# Patient Record
Sex: Male | Born: 1975 | Hispanic: Yes | Marital: Married | Smoking: Current some day smoker
Health system: Southern US, Community
[De-identification: ages and names within clinical notes are randomized; demographics above are authoritative.]

## PROBLEM LIST (undated history)

## (undated) DIAGNOSIS — R7309 Other abnormal glucose: Secondary | ICD-10-CM

## (undated) DIAGNOSIS — G5601 Carpal tunnel syndrome, right upper limb: Secondary | ICD-10-CM

## (undated) DIAGNOSIS — L817 Pigmented purpuric dermatosis: Secondary | ICD-10-CM

## (undated) DIAGNOSIS — R040 Epistaxis: Secondary | ICD-10-CM

## (undated) DIAGNOSIS — Z9889 Other specified postprocedural states: Secondary | ICD-10-CM

## (undated) DIAGNOSIS — E8881 Metabolic syndrome: Secondary | ICD-10-CM

## (undated) DIAGNOSIS — R112 Nausea with vomiting, unspecified: Secondary | ICD-10-CM

## (undated) DIAGNOSIS — K760 Fatty (change of) liver, not elsewhere classified: Secondary | ICD-10-CM

## (undated) DIAGNOSIS — R413 Other amnesia: Secondary | ICD-10-CM

## (undated) DIAGNOSIS — R0683 Snoring: Secondary | ICD-10-CM

## (undated) DIAGNOSIS — G5620 Lesion of ulnar nerve, unspecified upper limb: Secondary | ICD-10-CM

## (undated) DIAGNOSIS — E7800 Pure hypercholesterolemia, unspecified: Secondary | ICD-10-CM

## (undated) DIAGNOSIS — R1032 Left lower quadrant pain: Secondary | ICD-10-CM

## (undated) DIAGNOSIS — G5621 Lesion of ulnar nerve, right upper limb: Secondary | ICD-10-CM

## (undated) DIAGNOSIS — Z8781 Personal history of (healed) traumatic fracture: Secondary | ICD-10-CM

## (undated) DIAGNOSIS — R202 Paresthesia of skin: Principal | ICD-10-CM

## (undated) DIAGNOSIS — I10 Essential (primary) hypertension: Secondary | ICD-10-CM

## (undated) HISTORY — DX: Paresthesia of skin: R20.2

## (undated) HISTORY — DX: Lesion of ulnar nerve, unspecified upper limb: G56.20

## (undated) HISTORY — DX: Metabolic syndrome: E88.81

## (undated) HISTORY — DX: Essential (primary) hypertension: I10

## (undated) HISTORY — DX: Lesion of ulnar nerve, right upper limb: G56.21

## (undated) HISTORY — DX: Pigmented purpuric dermatosis: L81.7

## (undated) HISTORY — DX: Snoring: R06.83

## (undated) HISTORY — DX: Epistaxis: R04.0

## (undated) HISTORY — DX: Fatty (change of) liver, not elsewhere classified: K76.0

## (undated) HISTORY — DX: Left lower quadrant pain: R10.32

## (undated) HISTORY — PX: LACERATION REPAIR: SHX5168

## (undated) HISTORY — DX: Other abnormal glucose: R73.09

---
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
## 1998-11-13 ENCOUNTER — Emergency Department (HOSPITAL_COMMUNITY): Admission: EM | Admit: 1998-11-13 | Discharge: 1998-11-13 | Payer: Self-pay | Admitting: Emergency Medicine

## 1998-12-26 ENCOUNTER — Encounter: Payer: Self-pay | Admitting: Emergency Medicine

## 1998-12-26 ENCOUNTER — Emergency Department (HOSPITAL_COMMUNITY): Admission: EM | Admit: 1998-12-26 | Discharge: 1998-12-26 | Payer: Self-pay | Admitting: Emergency Medicine

## 1999-01-07 ENCOUNTER — Emergency Department (HOSPITAL_COMMUNITY): Admission: EM | Admit: 1999-01-07 | Discharge: 1999-01-07 | Payer: Self-pay

## 1999-01-10 ENCOUNTER — Emergency Department (HOSPITAL_COMMUNITY): Admission: EM | Admit: 1999-01-10 | Discharge: 1999-01-10 | Payer: Self-pay | Admitting: Emergency Medicine

## 1999-01-10 ENCOUNTER — Encounter: Payer: Self-pay | Admitting: Emergency Medicine

## 2001-10-21 ENCOUNTER — Encounter: Payer: Self-pay | Admitting: *Deleted

## 2001-10-21 ENCOUNTER — Emergency Department (HOSPITAL_COMMUNITY): Admission: EM | Admit: 2001-10-21 | Discharge: 2001-10-22 | Payer: Self-pay | Admitting: Emergency Medicine

## 2001-12-15 ENCOUNTER — Emergency Department (HOSPITAL_COMMUNITY): Admission: EM | Admit: 2001-12-15 | Discharge: 2001-12-15 | Payer: Self-pay | Admitting: Emergency Medicine

## 2003-03-31 ENCOUNTER — Emergency Department (HOSPITAL_COMMUNITY): Admission: EM | Admit: 2003-03-31 | Discharge: 2003-03-31 | Payer: Self-pay | Admitting: Emergency Medicine

## 2004-04-28 ENCOUNTER — Ambulatory Visit: Payer: Self-pay | Admitting: Sports Medicine

## 2005-04-05 ENCOUNTER — Ambulatory Visit: Payer: Self-pay | Admitting: Family Medicine

## 2005-05-21 HISTORY — PX: KNEE ARTHROSCOPY: SHX127

## 2005-06-07 ENCOUNTER — Emergency Department (HOSPITAL_COMMUNITY): Admission: EM | Admit: 2005-06-07 | Discharge: 2005-06-07 | Payer: Self-pay | Admitting: Emergency Medicine

## 2005-06-07 DIAGNOSIS — M2390 Unspecified internal derangement of unspecified knee: Secondary | ICD-10-CM | POA: Insufficient documentation

## 2005-06-13 ENCOUNTER — Emergency Department: Payer: Self-pay | Admitting: Emergency Medicine

## 2005-06-18 ENCOUNTER — Ambulatory Visit: Payer: Self-pay | Admitting: Family Medicine

## 2005-06-19 ENCOUNTER — Encounter: Admission: RE | Admit: 2005-06-19 | Discharge: 2005-06-19 | Payer: Self-pay | Admitting: Family Medicine

## 2005-06-22 ENCOUNTER — Encounter: Admission: RE | Admit: 2005-06-22 | Discharge: 2005-06-22 | Payer: Self-pay | Admitting: Family Medicine

## 2005-06-25 ENCOUNTER — Ambulatory Visit: Payer: Self-pay | Admitting: Family Medicine

## 2005-07-09 ENCOUNTER — Ambulatory Visit: Payer: Self-pay | Admitting: Family Medicine

## 2005-07-16 ENCOUNTER — Ambulatory Visit: Payer: Self-pay | Admitting: Unknown Physician Specialty

## 2005-08-27 ENCOUNTER — Ambulatory Visit: Payer: Self-pay | Admitting: Family Medicine

## 2005-09-19 ENCOUNTER — Ambulatory Visit: Payer: Self-pay | Admitting: Family Medicine

## 2006-05-21 HISTORY — PX: ELBOW SURGERY: SHX618

## 2006-07-18 DIAGNOSIS — IMO0002 Reserved for concepts with insufficient information to code with codable children: Secondary | ICD-10-CM | POA: Insufficient documentation

## 2006-07-18 DIAGNOSIS — H539 Unspecified visual disturbance: Secondary | ICD-10-CM | POA: Insufficient documentation

## 2006-07-20 DIAGNOSIS — Z8781 Personal history of (healed) traumatic fracture: Secondary | ICD-10-CM

## 2006-07-20 HISTORY — DX: Personal history of (healed) traumatic fracture: Z87.81

## 2006-07-25 DIAGNOSIS — S12100A Unspecified displaced fracture of second cervical vertebra, initial encounter for closed fracture: Secondary | ICD-10-CM | POA: Insufficient documentation

## 2006-08-07 ENCOUNTER — Telehealth: Payer: Self-pay | Admitting: *Deleted

## 2006-10-28 ENCOUNTER — Ambulatory Visit: Payer: Self-pay | Admitting: Family Medicine

## 2006-10-28 DIAGNOSIS — B353 Tinea pedis: Secondary | ICD-10-CM | POA: Insufficient documentation

## 2006-10-28 DIAGNOSIS — M7700 Medial epicondylitis, unspecified elbow: Secondary | ICD-10-CM | POA: Insufficient documentation

## 2006-10-28 DIAGNOSIS — I10 Essential (primary) hypertension: Secondary | ICD-10-CM | POA: Insufficient documentation

## 2006-10-28 DIAGNOSIS — H53009 Unspecified amblyopia, unspecified eye: Secondary | ICD-10-CM | POA: Insufficient documentation

## 2006-10-28 LAB — CONVERTED CEMR LAB
BUN: 24 mg/dL — ABNORMAL HIGH (ref 6–23)
Calcium: 9.1 mg/dL (ref 8.4–10.5)
Creatinine, Ser: 0.96 mg/dL (ref 0.40–1.50)
Glucose, Bld: 87 mg/dL (ref 70–99)
HDL: 39 mg/dL — ABNORMAL LOW (ref >39)
LDL Cholesterol: 144 mg/dL — ABNORMAL HIGH (ref 0–99)
Sodium: 139 meq/L (ref 135–145)
Triglycerides: 346 mg/dL — ABNORMAL HIGH (ref <150)

## 2006-10-29 ENCOUNTER — Encounter: Payer: Self-pay | Admitting: Family Medicine

## 2006-10-31 ENCOUNTER — Encounter: Payer: Self-pay | Admitting: Family Medicine

## 2006-11-01 ENCOUNTER — Encounter: Payer: Self-pay | Admitting: Family Medicine

## 2006-11-04 ENCOUNTER — Encounter: Payer: Self-pay | Admitting: Family Medicine

## 2006-11-06 ENCOUNTER — Encounter: Payer: Self-pay | Admitting: Family Medicine

## 2006-11-11 ENCOUNTER — Encounter: Payer: Self-pay | Admitting: Family Medicine

## 2006-11-26 ENCOUNTER — Encounter: Payer: Self-pay | Admitting: Family Medicine

## 2006-12-11 ENCOUNTER — Encounter: Payer: Self-pay | Admitting: Family Medicine

## 2007-03-19 ENCOUNTER — Encounter: Payer: Self-pay | Admitting: *Deleted

## 2007-03-19 ENCOUNTER — Ambulatory Visit: Payer: Self-pay | Admitting: Family Medicine

## 2007-06-02 DIAGNOSIS — H8110 Benign paroxysmal vertigo, unspecified ear: Secondary | ICD-10-CM | POA: Insufficient documentation

## 2007-08-18 ENCOUNTER — Ambulatory Visit (HOSPITAL_COMMUNITY): Admission: RE | Admit: 2007-08-18 | Discharge: 2007-08-18 | Payer: Self-pay | Admitting: Family Medicine

## 2007-08-18 ENCOUNTER — Ambulatory Visit: Payer: Self-pay | Admitting: Family Medicine

## 2007-08-18 ENCOUNTER — Telehealth: Payer: Self-pay | Admitting: *Deleted

## 2007-08-19 ENCOUNTER — Encounter: Payer: Self-pay | Admitting: Family Medicine

## 2007-08-20 ENCOUNTER — Encounter: Payer: Self-pay | Admitting: Family Medicine

## 2007-08-25 ENCOUNTER — Encounter: Payer: Self-pay | Admitting: Family Medicine

## 2007-08-26 ENCOUNTER — Encounter: Payer: Self-pay | Admitting: Family Medicine

## 2007-08-27 ENCOUNTER — Encounter: Payer: Self-pay | Admitting: Family Medicine

## 2007-09-08 ENCOUNTER — Encounter: Payer: Self-pay | Admitting: Family Medicine

## 2007-09-09 ENCOUNTER — Encounter: Payer: Self-pay | Admitting: Family Medicine

## 2007-09-10 ENCOUNTER — Encounter: Payer: Self-pay | Admitting: Family Medicine

## 2007-09-10 DIAGNOSIS — G5620 Lesion of ulnar nerve, unspecified upper limb: Secondary | ICD-10-CM

## 2007-09-10 HISTORY — DX: Lesion of ulnar nerve, unspecified upper limb: G56.20

## 2007-09-24 ENCOUNTER — Encounter: Payer: Self-pay | Admitting: Family Medicine

## 2007-09-25 ENCOUNTER — Ambulatory Visit: Payer: Self-pay | Admitting: Family Medicine

## 2007-09-25 DIAGNOSIS — G44319 Acute post-traumatic headache, not intractable: Secondary | ICD-10-CM | POA: Insufficient documentation

## 2007-09-26 ENCOUNTER — Encounter: Payer: Self-pay | Admitting: Family Medicine

## 2007-10-08 ENCOUNTER — Encounter: Payer: Self-pay | Admitting: Family Medicine

## 2007-10-16 ENCOUNTER — Encounter: Payer: Self-pay | Admitting: Family Medicine

## 2007-10-27 ENCOUNTER — Telehealth: Payer: Self-pay | Admitting: Family Medicine

## 2007-10-29 DIAGNOSIS — M542 Cervicalgia: Secondary | ICD-10-CM | POA: Insufficient documentation

## 2007-10-30 ENCOUNTER — Encounter: Payer: Self-pay | Admitting: Family Medicine

## 2007-11-19 ENCOUNTER — Telehealth: Payer: Self-pay | Admitting: Family Medicine

## 2008-02-13 ENCOUNTER — Ambulatory Visit: Payer: Self-pay | Admitting: Family Medicine

## 2008-04-29 ENCOUNTER — Encounter: Payer: Self-pay | Admitting: Family Medicine

## 2008-05-28 ENCOUNTER — Telehealth: Payer: Self-pay | Admitting: *Deleted

## 2009-03-14 ENCOUNTER — Ambulatory Visit: Payer: Self-pay | Admitting: Family Medicine

## 2009-06-22 ENCOUNTER — Telehealth: Payer: Self-pay | Admitting: Family Medicine

## 2010-04-06 ENCOUNTER — Telehealth (INDEPENDENT_AMBULATORY_CARE_PROVIDER_SITE_OTHER): Payer: Self-pay | Admitting: *Deleted

## 2010-04-16 ENCOUNTER — Emergency Department (HOSPITAL_COMMUNITY): Admission: EM | Admit: 2010-04-16 | Discharge: 2010-04-16 | Payer: Self-pay | Admitting: Family Medicine

## 2010-06-18 LAB — CONVERTED CEMR LAB
BUN: 17 mg/dL (ref 6–23)
CO2: 25 meq/L (ref 19–32)
Calcium: 9.4 mg/dL (ref 8.4–10.5)
Creatinine, Ser: 0.92 mg/dL (ref 0.40–1.50)
Glucose, Bld: 93 mg/dL (ref 70–99)
Glucose, Urine, Semiquant: NEGATIVE
Protein, U semiquant: NEGATIVE

## 2010-06-20 NOTE — Progress Notes (Signed)
Summary: phn msg  Phone Note Call from Patient Call back at [phone]   Caller: Spouse-Calvin Johnston Summary of Call: daughter Calvin Johnston) was tested positive for strep a couple weeks ago and now he has fever/sore throat - was told that if any family member has symptoms that they would call something in for him. [facility] Initial call taken by: De Nurse,  April 06, 2010 9:13 AM  Follow-up for Phone Call        spoke with patient and he states Calvin Johnston was treated  at Urgent Care  two weeks ago for strep. advised that we will need to schedule him an appointment here to be seen  in order to treat him. offerred appointment this afternoon. states he has had feve r for 3 days.  he will need to call back about coming in he states .  suggested he can try calling urgent care to ask if they will treat him since this is what they told him. he will call back if appointment is needed. Follow-up by: Theresia Lo RN,  April 06, 2010 11:31 AM

## 2010-06-20 NOTE — Progress Notes (Signed)
Summary: Tooth Pain   Phone Note Call from Patient   Summary of Call: Tooth pain since yesterday. Took tylenol - did not help.   Took antibiotic from [place] - "ampicillina" Denies fever, difficulty w/ breathing or swallowing, trauma to tooth.  Advised to take ibuprofen for pain, call for appointment to be seen in morning. Reviewed red flags to come in to ER.  Initial call taken by: Bobby Rumpf  MD,  June 22, 2009 10:44 PM     Appended Document: Tooth Pain  left message

## 2010-10-23 ENCOUNTER — Ambulatory Visit (INDEPENDENT_AMBULATORY_CARE_PROVIDER_SITE_OTHER): Payer: Self-pay | Admitting: Family Medicine

## 2010-10-23 ENCOUNTER — Encounter: Payer: Self-pay | Admitting: Family Medicine

## 2010-10-23 VITALS — BP 150/90 | HR 100 | Wt 195.0 lb

## 2010-10-23 DIAGNOSIS — I10 Essential (primary) hypertension: Secondary | ICD-10-CM

## 2010-10-23 LAB — CBC
MCHC: 33.9 g/dL (ref 30.0–36.0)
Platelets: 250 10*3/uL (ref 150–400)
RDW: 13.4 % (ref 11.5–15.5)
WBC: 7.1 10*3/uL (ref 4.0–10.5)

## 2010-10-23 LAB — BASIC METABOLIC PANEL
Chloride: 105 mEq/L (ref 96–112)
Potassium: 4.2 mEq/L (ref 3.5–5.3)

## 2010-10-23 MED ORDER — AMLODIPINE BESYLATE 5 MG PO TABS: 5.0000 mg | ORAL_TABLET | Freq: Every day | ORAL | Status: DC

## 2010-10-23 NOTE — Patient Instructions (Addendum)
Start Amlodipine 5 mg tablet by mouth once a day to treat your high Blood Pressure. Check your Blood Pressure three times a week. If you are averaging over 140/90 we may need to increase your Amlodipine to 10 mg daily.   Dr Forest Pruden will let you know the results of your blood tests.

## 2010-10-24 ENCOUNTER — Encounter: Payer: Self-pay | Admitting: Family Medicine

## 2010-10-24 NOTE — Progress Notes (Signed)
  Subjective:    Patient ID: Calvin Johnston, male    DOB: 12/03/1975, 35 y.o.   MRN: [id number]  HPI Patient is accompanied by his wife.   HYPERTENSION  Disease Monitoring  Blood pressure range: At home BP by wife is 140 to 170 SBP  Chest pain: no   Dyspnea: no   Claudication: no   Medication compliance: On no medications.  Was on HCTZ in past.   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 No seizures.  No syncope. No chest pain.  No shortness of breath.  Improves when he cools down.  Makes himself drink plenty of fluid at work. 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  No one is hiring outside his skilled trade.   Review of Systems      Objective:   Physical Exam  Constitutional: He appears well-nourished. No distress.       Obese  HENT:  Right Ear: External ear normal.  Left Ear: External ear normal.  Eyes: Conjunctivae and EOM are normal. Pupils are equal, round, and reactive to light.  Fundoscopic exam:      The right eye shows arteriolar narrowing. The right eye shows no papilledema.       The left eye shows arteriolar narrowing. The left eye shows no papilledema.  Neck: Trachea normal and normal range of motion. Neck supple. Normal carotid pulses, no hepatojugular reflux and no JVD present. Carotid bruit is not present. No mass present.  Cardiovascular: Normal rate, regular rhythm, S1 normal, S2 normal, normal heart sounds and intact distal pulses.  PMI is not displaced.  Exam reveals no gallop.   No murmur heard. Pulmonary/Chest: Effort normal and breath sounds normal.  Abdominal: Soft. Normal appearance, normal aorta and bowel sounds are normal. He exhibits no distension and no abdominal bruit. There is no hepatosplenomegaly. There is no  tenderness.  Neurological: He is alert.  Skin: He is not diaphoretic.  Psychiatric: He has a normal mood and affect. His speech is normal and behavior is normal. Thought content normal. Cognition and memory are normal.          Assessment & Plan:

## 2010-10-25 ENCOUNTER — Encounter: Payer: Self-pay | Admitting: Family Medicine

## 2010-10-25 NOTE — Assessment & Plan Note (Addendum)
Patient with persistent elevated BP with a family history of Hypertension. No evidence of end-organ damage.   Plan to start Amlodipine 5mg  po daily. His wife will check BP three times a week and bring in record on her visit with her dgt next month. Will titrate based on patients BP home diary readings.  Need to check fasting Lipids next visit. 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  BP Readings from Last 3 Encounters:  10/23/10 150/90  09/25/07 143/94  08/18/07 151/95   Lab Results  Component Value Date   WBC 7.1 10/23/2010   HGB 15.4 10/23/2010   HCT 45.4 10/23/2010   MCV 88.0 10/23/2010   PLT 250 10/23/2010   Lab Results  Component Value Date   CREATININE 1.00 10/23/2010   BUN 21 10/23/2010   NA 140 10/23/2010   K 4.2 10/23/2010   CL 105 10/23/2010   CO2 26 10/23/2010

## 2010-11-17 ENCOUNTER — Telehealth: Payer: Self-pay | Admitting: Family Medicine

## 2010-11-17 DIAGNOSIS — I10 Essential (primary) hypertension: Secondary | ICD-10-CM

## 2010-11-17 NOTE — Telephone Encounter (Signed)
His BP has been running between 140/72 - 145/92 and is not sure if his meds needs to be adjusted.

## 2010-11-17 NOTE — Telephone Encounter (Signed)
Will forward to MD, Patient just seen earlier this month, does he need to come back in?

## 2010-11-20 MED ORDER — AMLODIPINE BESYLATE 5 MG PO TABS: 10.0000 mg | ORAL_TABLET | Freq: Every day | ORAL | Status: DC

## 2010-11-20 MED ORDER — AMLODIPINE BESYLATE 10 MG PO TABS: 10.0000 mg | ORAL_TABLET | Freq: Every day | ORAL | Status: DC

## 2010-11-20 NOTE — Telephone Encounter (Signed)
Ms Calvin Johnston informed of message from MD, she will let [name] know and contact us if any other concerns.

## 2010-11-20 NOTE — Telephone Encounter (Signed)
Please ask Mr Calvin Johnston to increase his Amlodipine 5 mg tablet to two tablets a day from one a day to improve his blood pressure control even more.  Let him know that when he refills his blood pressure medication, that I will refill his Amlodipine with 10 mg tablets instead of 5 mg tablets.

## 2011-01-08 ENCOUNTER — Ambulatory Visit: Payer: Self-pay | Admitting: Family Medicine

## 2011-01-09 ENCOUNTER — Ambulatory Visit (INDEPENDENT_AMBULATORY_CARE_PROVIDER_SITE_OTHER): Payer: Self-pay | Admitting: Family Medicine

## 2011-01-09 ENCOUNTER — Ambulatory Visit (HOSPITAL_COMMUNITY)
Admission: RE | Admit: 2011-01-09 | Discharge: 2011-01-09 | Disposition: A | Discharge status: Home / Self Care | Payer: Self-pay | Source: Ambulatory Visit | Attending: Family Medicine | Admitting: Family Medicine

## 2011-01-09 ENCOUNTER — Encounter: Payer: Self-pay | Admitting: Family Medicine

## 2011-01-09 VITALS — BP 155/91 | HR 90 | Temp 98.2°F | Ht 65.75 in | Wt 196.0 lb

## 2011-01-09 DIAGNOSIS — R05 Cough: Secondary | ICD-10-CM | POA: Insufficient documentation

## 2011-01-09 DIAGNOSIS — R059 Cough, unspecified: Secondary | ICD-10-CM

## 2011-01-09 DIAGNOSIS — J40 Bronchitis, not specified as acute or chronic: Secondary | ICD-10-CM

## 2011-01-09 MED ORDER — AZITHROMYCIN 250 MG PO TABS: ORAL_TABLET | ORAL | Status: AC

## 2011-01-09 NOTE — Patient Instructions (Signed)
Bronchitis Bronchitis is the body's way of reacting to injury and/or infection (inflammation) of the bronchi. Bronchi are the air tubes that extend from the windpipe into the lungs. If the inflammation becomes severe, it may cause shortness of breath.  CAUSES Inflammation may be caused by:  A virus.   Germs (bacteria).   Dust.   Allergens.   Pollutants and many other irritants.  The cells lining the bronchial tree are covered with tiny hairs (cilia). These constantly beat upward, away from the lungs, toward the mouth. This keeps the lungs free of pollutants. When these cells become too irritated and are unable to do their job, mucus begins to develop. This causes the characteristic cough of bronchitis. The cough clears the lungs when the cilia are unable to do their job. Without either of these protective mechanisms, the mucus would settle in the lungs. Then you would develop pneumonia. Smoking is a common cause of bronchitis and can contribute to pneumonia. Stopping this habit is the single most important thing you can do to help yourself. TREATMENT  Your caregiver may prescribe an antibiotic if the cough is caused by bacteria. Also, medicines that open up your airways make it easier to breathe. Your caregiver may also recommend or prescribe an expectorant. It will loosen the mucus to be coughed up. Only take over-the-counter or prescription medicines for pain, discomfort, or fever as directed by your caregiver.   Removing whatever causes the problem (smoking, for example) is critical to preventing the problem from getting worse.   Cough suppressants may be prescribed for relief of cough symptoms.   Inhaled medicines may be prescribed to help with symptoms now and to help prevent problems from returning.   For those with recurrent (chronic) bronchitis, there may be a need for steroid medicines.  SEEK IMMEDIATE MEDICAL CARE IF:  During treatment, you develop more pus-like mucus  (purulent sputum).   You or your child has an oral temperature above 100.2, not controlled by medicine.   Your baby is older than 3 months with a rectal temperature of 102 F (38.9 C) or higher.   Your baby is 59 months old or younger with a rectal temperature of 100.4 F (38 C) or higher.   You become progressively more ill.   You have increased difficulty breathing, wheezing, or shortness of breath.  It is necessary to seek immediate medical care if you are elderly or sick from any other disease. MAKE SURE YOU:  Understand these instructions.   Will watch your condition.   Will get help right away if you are not doing well or get worse.  Document Released: 05/07/2005 Document Re-Released: 08/01/2009 [facility] Patient Information 2011 [place], [place].

## 2011-01-09 NOTE — Progress Notes (Signed)
  Subjective:    Patient ID: Calvin Johnston, male    DOB: 1975-08-01, 35 y.o.   MRN: [id number]  HPI Pt here for acute visit with cough x 1 month.  Pt reports cough x 1 month. Cough is non productive.  Pt also with rhinorrhea and nasal congestion initially, nasal congestion has persisted. No fevers, increased WOB. Pt is a nonsmoker, does work as a Presenter, broadcasting.  Pt has used OTC cough and cold medications with minimal improvement in sxs. + sick contacts in wife and daughter. Both have had cough >4 weeks. Both wife and daughter required antibiotics and CXRs.   Review of Systems See HPI     Objective:   Physical Exam Gen: up in chair, NAD HEENT: NCAT, EOMI, TMs clear bilaterally, + nasal erythema bilaterally  CV: RRR, no murmurs auscultated PULM: CTAB, no wheezes, rales ,+ cough with deep inspiration. ABD: S/NT/+ bowel sounds  EXT: 2+ peripheral pulses   Assessment & Plan:

## 2011-01-09 NOTE — Assessment & Plan Note (Signed)
Likely viral process with concern for atypical process given sick contacts and work exposure (inhaled particulates). Will obtain CXR. Will cover with azithromycin. Discussed red flags for return.

## 2011-01-10 ENCOUNTER — Encounter: Payer: Self-pay | Admitting: Family Medicine

## 2011-04-22 ENCOUNTER — Telehealth: Payer: Self-pay | Admitting: Family Medicine

## 2011-04-22 NOTE — Telephone Encounter (Signed)
Patient with hemorrhoids bought preparation H OTC. ? If safe with Norvasc. Looked it up, no interactions. Should be safe. Advised use, but stop of pt develops HA, CP, blurred vision. Wife expressed understanding and agreed with plan.

## 2011-05-16 ENCOUNTER — Telehealth: Payer: Self-pay | Admitting: Family Medicine

## 2011-05-16 NOTE — Telephone Encounter (Signed)
Pt's wife called, he is out of town for work right now, but is having a bad headache.  His blood pressure has been normal. No vision changes, nausea or vomiting, no fevers.  Patient's wife says he described a tingling sensation too.  He used to have headaches years ago when he had a head injury, but has not recently.    Advised patient should likely be evaluated in the next day or so if headache persists.  Concern for migraines.  Advised to go to ER for vision changes, dizziness, "black out" spells, or elevated blood pressure.

## 2011-05-19 ENCOUNTER — Emergency Department (HOSPITAL_COMMUNITY)
Admission: EM | Admit: 2011-05-19 | Discharge: 2011-05-20 | Disposition: A | Discharge status: Home / Self Care | Payer: Self-pay | Attending: Emergency Medicine | Admitting: Emergency Medicine

## 2011-05-19 ENCOUNTER — Encounter (HOSPITAL_COMMUNITY): Payer: Self-pay | Admitting: *Deleted

## 2011-05-19 DIAGNOSIS — E7800 Pure hypercholesterolemia, unspecified: Secondary | ICD-10-CM | POA: Insufficient documentation

## 2011-05-19 DIAGNOSIS — R209 Unspecified disturbances of skin sensation: Secondary | ICD-10-CM | POA: Insufficient documentation

## 2011-05-19 DIAGNOSIS — I10 Essential (primary) hypertension: Secondary | ICD-10-CM | POA: Insufficient documentation

## 2011-05-19 DIAGNOSIS — R51 Headache: Secondary | ICD-10-CM | POA: Insufficient documentation

## 2011-05-19 MED ORDER — SODIUM CHLORIDE 0.9 % IV BOLUS (SEPSIS)
1000.0000 mL | Freq: Once | INTRAVENOUS | Status: AC
  Administered 2011-05-19: 1000 mL via INTRAVENOUS

## 2011-05-19 MED ORDER — METOCLOPRAMIDE HCL 5 MG/ML IJ SOLN
10.0000 mg | Freq: Once | INTRAMUSCULAR | Status: AC
  Administered 2011-05-19: 10 mg via INTRAVENOUS
  Filled 2011-05-19: qty 2

## 2011-05-19 MED ORDER — DIPHENHYDRAMINE HCL 50 MG/ML IJ SOLN
25.0000 mg | Freq: Once | INTRAMUSCULAR | Status: AC
  Administered 2011-05-19: via INTRAVENOUS
  Filled 2011-05-19: qty 1

## 2011-05-19 MED ORDER — DEXAMETHASONE SODIUM PHOSPHATE 10 MG/ML IJ SOLN
10.0000 mg | Freq: Once | INTRAMUSCULAR | Status: AC
  Administered 2011-05-19: 10 mg via INTRAVENOUS
  Filled 2011-05-19: qty 1
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
## 2011-05-19 NOTE — ED Notes (Signed)
Complaining of headache for 3 weeks. States he woke up with headache one morning. Has taken OTC with no relief. Bending over, picking up heavy objects makes worse. Also states he has numbness and tingling all over head. No sensitivity to light or sound.

## 2011-05-19 NOTE — ED Notes (Signed)
Headache for the past 3 weeks  No nv or diarrhea.  He has taken otc meds that have not helped

## 2011-05-19 NOTE — ED Notes (Signed)
Midlevel at bedside evaluating patient

## 2011-05-20 ENCOUNTER — Emergency Department (HOSPITAL_COMMUNITY): Payer: Self-pay

## 2011-05-20 MED ORDER — KETOROLAC TROMETHAMINE 30 MG/ML IJ SOLN
30.0000 mg | Freq: Once | INTRAMUSCULAR | Status: AC
  Administered 2011-05-20: 30 mg via INTRAVENOUS
  Filled 2011-05-20: qty 1

## 2011-05-20 MED ORDER — OXYCODONE-ACETAMINOPHEN 5-325 MG PO TABS: 2.0000 | ORAL_TABLET | ORAL | Status: AC | PRN

## 2011-05-20 NOTE — ED Provider Notes (Signed)
Medical screening examination/treatment/procedure(s) were performed by non-physician practitioner and as supervising physician I was immediately available for consultation/collaboration.   Joya Gaskins, MD 05/20/11 [phone]

## 2011-05-21 NOTE — ED Notes (Signed)
Pt's wife called in to say that pt's Percocet was causing him continuous hiccups and wanted to know if we could call something else in for Headaches.  Educational psychologist, PA treated the pt yesterday and was here today and gave me a verbal order for Ultram 50mg , 1 tab q 4-6 hrs PRN pain, quan 20. It was called into the [facility] at [phone]

## 2012-01-05 ENCOUNTER — Telehealth: Payer: Self-pay | Admitting: Family Medicine
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
## 2012-01-07 ENCOUNTER — Other Ambulatory Visit: Payer: Self-pay | Admitting: Family Medicine

## 2012-01-07 NOTE — Telephone Encounter (Signed)
Appt with PCP required for additional refills

## 2012-02-25 ENCOUNTER — Other Ambulatory Visit: Payer: Self-pay | Admitting: Family Medicine

## 2012-05-22 ENCOUNTER — Telehealth: Payer: Self-pay | Admitting: Family Medicine

## 2012-05-22 DIAGNOSIS — J111 Influenza due to unidentified influenza virus with other respiratory manifestations: Secondary | ICD-10-CM

## 2012-05-22 MED ORDER — OSELTAMIVIR PHOSPHATE 75 MG PO CAPS: 75.0000 mg | ORAL_CAPSULE | Freq: Two times a day (BID) | ORAL | Status: AC

## 2012-05-22 NOTE — Telephone Encounter (Signed)
Wife is calling to let us know that he has a fever, chills, and cough. She is concerned that he may have the flu. This started this morning. She has tried to Dayquil, and his fever is 101.2. Daughter recently had the flu on 05/14/12. She is calling to see if anything can be done. Give symptoms < 24 hours and known flu exposure, I will send in prescription for tamiflu. She will also give him ibuprofen 400 mg q 8 hours.   Si Raider Clinton Sawyer, MD, MBA 05/22/2012, 7:17 PM Family Medicine Resident, PGY-2 [phone] pager

## 2012-05-22 NOTE — Telephone Encounter (Signed)
Wife reports since inhaling dust patient has been trying to clear throat.  All night had this sensation of having to clear throat. He has calld her and ask her to call this office. Consulted with Dr.  Mauricio Po and [place] and they advised that nothing OTC should be seen .   Can schedule tomorrow AM , go to UC or ED if worsens.   Also he states sometimes he has some chest pain. Advised if having chest pain needs to be seen today . Wife will call him back now and discuss and give message from MD.
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
## 2013-03-13 ENCOUNTER — Other Ambulatory Visit: Payer: Self-pay | Admitting: Family Medicine

## 2013-03-13 NOTE — Telephone Encounter (Signed)
Blue team-Patient has not been seen in > 2 years. Needs office visit. Gave #90 of amlodipine.

## 2013-03-16 NOTE — Telephone Encounter (Signed)
Pt informed that he will need appt.  Rest of family sees McDiarmid so he will be changed to him as well. [place], Maryjo [place]

## 2013-07-20 ENCOUNTER — Other Ambulatory Visit: Payer: Self-pay | Admitting: Family Medicine

## 2013-09-23 ENCOUNTER — Ambulatory Visit (INDEPENDENT_AMBULATORY_CARE_PROVIDER_SITE_OTHER): Payer: BC Managed Care – PPO | Admitting: Family Medicine

## 2013-09-23 ENCOUNTER — Encounter: Payer: Self-pay | Admitting: Family Medicine

## 2013-09-23 VITALS — BP 136/78 | HR 100 | Temp 98.9°F | Resp 18 | Wt 206.0 lb

## 2013-09-23 DIAGNOSIS — S99929A Unspecified injury of unspecified foot, initial encounter: Secondary | ICD-10-CM | POA: Insufficient documentation

## 2013-09-23 DIAGNOSIS — G5620 Lesion of ulnar nerve, unspecified upper limb: Secondary | ICD-10-CM

## 2013-09-23 DIAGNOSIS — I10 Essential (primary) hypertension: Secondary | ICD-10-CM

## 2013-09-23 DIAGNOSIS — S99919A Unspecified injury of unspecified ankle, initial encounter: Secondary | ICD-10-CM

## 2013-09-23 DIAGNOSIS — G5621 Lesion of ulnar nerve, right upper limb: Secondary | ICD-10-CM | POA: Insufficient documentation

## 2013-09-23 DIAGNOSIS — S8990XA Unspecified injury of unspecified lower leg, initial encounter: Secondary | ICD-10-CM

## 2013-09-23 HISTORY — DX: Lesion of ulnar nerve, right upper limb: G56.21

## 2013-09-23 MED ORDER — AMLODIPINE BESYLATE 10 MG PO TABS: ORAL_TABLET | ORAL | Status: DC

## 2013-09-23 MED ORDER — TRIAMCINOLONE ACETONIDE 0.1 % EX CREA: 1.0000 "application " | TOPICAL_CREAM | Freq: Two times a day (BID) | CUTANEOUS | Status: DC

## 2013-09-23 NOTE — Assessment & Plan Note (Signed)
Exam most consistent with ulnar neuropathy. Likely due to overuse - discussed may need referral but will plan on starting with exercises - handout given - recommend f/u in 2 months for re-eval with PCP - no muscle wasting or signs of motor damage.

## 2013-09-23 NOTE — Progress Notes (Signed)
Subjective:     Patient ID: Calvin Johnston, male   DOB: [birth year], 38 y.o.   MRN: [id number]  HPI  38 yo here for BP refill and right 2nd toe.   1) HTN - is on amlodipine - taking it regularly  - no concerns.  - no HA, vision changes, chest pain, sob, LEE - hasn't been seen since 2012   2) right 2nd toe - has been darker for the last 6 months - no pain, no discomfort, numbness, tingling or other symptoms. Doesn't seem to be spreading.  - skin feels rougher - works with concrete and wears steel toe boots - believes it was there prior to this pair of boots but could be worsening.   - always buys the same pair of boots.    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     Review of Systems See above     Objective:   Physical Exam  Constitutional: He appears well-developed and well-nourished.  HENT:  Head: Normocephalic and atraumatic.  Neck: Neck supple.  Cardiovascular: Normal rate, regular rhythm and normal heart sounds.   Pulmonary/Chest: Effort normal and breath sounds normal.  Abdominal: Soft.  Musculoskeletal:       Right elbow: He exhibits normal range of motion, no swelling, no effusion and no deformity. No radial head, no medial epicondyle, no lateral epicondyle and no olecranon process tenderness noted.  + tinel's at the elbow on the ulnar nerve Neg froment's test   Skin:          Assessment:     Hypertension - Plan: Comprehensive metabolic panel, Lipid Panel  Ulnar nerve compression  Toe trauma       Plan:     See A&P section

## 2013-09-23 NOTE — Assessment & Plan Note (Addendum)
Unclear etiology. ddx includes chronic trauma due to shoes vs. Discoloration at baseline vs. Possible callous and repetetive injury.  - discussed with pt that some of the areas seem very much like a bruise and best recommendation is to change brands of shoes for some with a wider toe box - does have tinea pedis and will consider treating this based on results of lab work although I doubt this is related - f/u with PCP after change in shoes.

## 2013-09-23 NOTE — Patient Instructions (Signed)
Ulnar Nerve Contusion with Rehab The ulnar nerve lies near the surface of the skin as it passes by the elbow. This location causes it to be susceptible to injury. An ulnar nerve contusion is a bruise of the nerve. It is the result of direct trauma to the elbow. Ulnar nerve contusions are characterized by pain, weakness, and loss of feeling in the hand. SYMPTOMS   Signs of nerve damage include: tingling, numbness, weakness, and/or loss of feeling in the hand, specifically the little finger and ring finger.  Sharp pains that may shoot from the elbow to the wrist and hand.  Decreased hand function.  Tenderness and/ or inflammation in the elbow.  Muscle wasting (atrophy) in the hand. CAUSES  Ulnar nerve contusions are caused by direct trauma to the elbow that results in bleeding which enters the nerve. RISK INCREASES WITH:  Contact sports (football, soccer, or rugby).  Bleeding disorders.  Taking blood thinning medicine (warfarin [Coumadin], aspirin, or nonsteroidal anti-inflammatory medications).  Diabetes mellitus.  Underactive thyroid gland (hypothyroidism). PREVENTION  Wear properly fitted and padded protective equipment.  Only take blood thinning medication when necessary. PROGNOSIS  Ulnar nerve contusions usually heal within 6 weeks. Healing often occurs spontaneously, but treatment helps reduce symptoms.  RELATED COMPLICATIONS   Permanent nerve damage, including pain, numbness, tingling, or weakness in the hand (rare).  Weak grip.  Prolonged healing time, if improperly treated or re-injured. TREATMENT  Treatment initially involves resting from any activities that aggravate the symptoms, and the use of ice and medications to help reduce pain and inflammation. The use of strengthening and stretching exercises may help reduce pain with activity. These exercises may be performed at home or with referral to a therapist. Your caregiver may recommend that you splint the elbow at  night to help healing of the nerve. If symptoms persist despite conservative (non-surgical) treatment, then surgery may be recommended to free the nerve. MEDICATION   If pain medication is necessary, then nonsteroidal anti-inflammatory medications, such as aspirin and ibuprofen, or other minor pain relievers, such as acetaminophen, are often recommended.  Do not take pain medication within 7 days before surgery.  Prescription pain relievers may be given if deemed necessary by your caregiver. Use only as directed and only as much as you need. COLD THERAPY  Cold treatment (icing) relieves pain and reduces inflammation. Cold treatment should be applied for 10 to 15 minutes every 2 to 3 hours for inflammation and pain and immediately after any activity that aggravates your symptoms. Use ice packs or massage the area with a piece of ice (ice massage). SEEK MEDICAL CARE IF:   Treatment seems to offer no benefit, or the condition worsens.  Any medications produce adverse side effects. EXERCISES RANGE OF MOTION (ROM) AND STRETCHING EXERCISES - Ulnar Nerve Contusion These exercises may help you when beginning to rehabilitate your injury. Do not begin these exercises until your physician, physical therapist or athletic trainer advises you to do so. Discontinue any exercise that worsens your symptoms. Contact your physician with instructions on how to continue. Your symptoms may resolve with or without further involvement from your physician, physical therapist or athletic trainer. While completing these exercises, remember:  Restoring tissue flexibility helps normal motion to return to the joints. This allows healthier, less painful movement and activity.  An effective stretch should be held for at least 30 seconds.  A stretch should never be painful. You should only feel a gentle lengthening or release in the stretched tissue. RANGE   OF MOTION  Extension  Hold your right / left arm at your side and  straighten your elbow as far as you can using your right / left arm muscles.  Straighten the right / left elbow farther by gently pushing down on your forearm until you feel a gentle stretch on the inside of your elbow. Hold this position for __________ seconds.  Slowly return to the starting position. Repeat __________ times. Complete this exercise __________ times per day.  RANGE OF MOTION  Flexion  Hold your right / left arm at your side and bend your elbow as far as you can using your right / left arm muscles.  Bend the right / left elbow farther by gently pushing up on your forearm until you feel a gentle stretch on the outside of your elbow. Hold this position for __________ seconds.  Slowly return to the starting position. Repeat __________ times. Complete this exercise __________ times per day.  RANGE OF MOTION  Wrist Flexion, Active-Assisted  Extend your right / left elbow with your fingers pointing down.*  Gently pull the back of your hand towards you until you feel a gentle stretch on the top of your forearm.  Hold this position for __________ seconds. Repeat __________ times. Complete this exercise __________ times per day.  *If directed by your physician, physical therapist or athletic trainer, complete this stretch with your elbow bent rather than extended. RANGE OF MOTION  Wrist Extension, Active-Assisted  Extend your right / left elbow and turn your palm upwards.*  Gently pull your palm/fingertips back so your wrist extends and your fingers point more toward the ground.  You should feel a gentle stretch on the inside of your forearm.  Hold this position for __________ seconds. Repeat __________ times. Complete this exercise __________ times per day. *If directed by your physician, physical therapist or athletic trainer, complete this stretch with your elbow bent, rather than extended. RANGE OF MOTION  Supination, Active  Stand or sit with your elbows at your side.  Bend your right / left elbow to 90 degrees.  Turn your palm upward until you feel a gentle stretch on the inside of your forearm.  Hold this position for __________ seconds. Slowly release and return to the starting position. Repeat __________ times. Complete this stretch __________ times per day.  RANGE OF MOTION  Pronation, Active  Stand or sit with your elbows at your side. Bend your right / left elbow to 90 degrees.  Turn your palm downward until you feel a gentle stretch on the top of your forearm.  Hold this position for __________ seconds. Slowly release and return to the starting position. Repeat __________ times. Complete this stretch __________ times per day.  STRETCH - Wrist Flexion   Place the back of your right / left hand on a tabletop leaving your elbow slightly bent. Your fingers should point away from your body.  Gently press the back of your hand down onto the table by straightening your elbow. You should feel a stretch on the top of your forearm.  Hold this position for __________ seconds. Repeat __________ times. Complete this stretch __________ times per day.  STRETCH  Wrist Extension   Place your right / left fingertips on a tabletop leaving your elbow slightly bent. Your fingers should point backwards.  Gently press your fingers and palm down onto the table by straightening your elbow. You should feel a stretch on the inside of your forearm.  Hold this position for __________   seconds. Repeat __________ times. Complete this stretch __________ times per day.  STRENGTHENING EXERCISES - Ulnar Nerve Contusion These exercises may help you when beginning to rehabilitate your injury. Do not begin these exercises until your physician, physical therapist or athletic trainer advises you to do so. Discontinue any exercise that worsens your symptoms. Contact your physician for instructions on how to continue. They may resolve your symptoms with or without further involvement  from your physician, physical therapist or athletic trainer. While completing these exercises, remember:   Muscles can gain both the endurance and the strength needed for everyday activities through controlled exercises.  Complete these exercises as instructed by your physician, physical therapist or athletic trainer. Progress with the resistance and repetition exercises only as your caregiver advises. STRENGTH Wrist Flexors  Sit with your right / left forearm palm-up and fully supported. Your elbow should be resting below the height of your shoulder. Allow your wrist to extend over the edge of the surface.  Loosely holding a __________ weight or a piece of rubber exercise band/tubing, slowly curl your hand up toward your forearm.  Hold this position for __________ seconds. Slowly lower the wrist back to the starting position in a controlled manner. Repeat __________ times. Complete this exercise __________ times per day.  STRENGTH  Wrist Extensors  Sit with your right / left forearm palm-down and fully supported. Your elbow should be resting below the height of your shoulder. Allow your wrist to extend over the edge of the surface.  Loosely holding a __________ weight or a piece of rubber exercise band/tubing, slowly curl your hand up toward your forearm.  Hold this position for __________ seconds. Slowly lower the wrist back to the starting position in a controlled manner. Repeat __________ times. Complete this exercise __________ times per day.  STRENGTH - Ulnar Deviators  Stand with a ____________________ weight in your right / left hand or sit holding on to the rubber exercise band/tubing with your opposite arm supported.  Move your wrist so that your pinkie travels toward your forearm and your thumb moves away from your forearm.  Hold this position for __________ seconds and then slowly lower the wrist back to the starting position. Repeat __________ times. Complete this exercise  __________ times per day STRENGTH - Radial Deviators  Stand with a ____________________ weight in your right / left hand or sit holding on to the rubber exercise band/tubing with your arm supported.  Raise your hand upward in front of you or pull up on the rubber tubing.  Hold this position for __________ seconds and then slowly lower the wrist back to the starting position. Repeat __________ times. Complete this exercise __________ times per day. STRENGTH - Grip  Grasp a tennis ball, a dense sponge, or a large, rolled sock in your hand.  Squeeze as hard as you can without increasing any pain.  Hold this position for __________ seconds. Release your grip slowly. Repeat __________ times. Complete this exercise __________ times per day.  Document Released: 05/07/2005 Document Revised: 07/30/2011 Document Reviewed: 08/19/2008 [employer] Patient Information 2014 [employer].  

## 2013-09-23 NOTE — Assessment & Plan Note (Signed)
Appears controlled on current regimen.  - will send for cmp and FLP today - refill of amlodipine given

## 2013-09-24 LAB — COMPREHENSIVE METABOLIC PANEL
ALBUMIN: 4.6 g/dL (ref 3.5–5.2)
ALT: 133 U/L — AB (ref 0–53)
AST: 60 U/L — AB (ref 0–37)
Alkaline Phosphatase: 77 U/L (ref 39–117)
BUN: 20 mg/dL (ref 6–23)
CALCIUM: 9.6 mg/dL (ref 8.4–10.5)
CHLORIDE: 103 meq/L (ref 96–112)
CO2: 22 mEq/L (ref 19–32)
Creat: 1.01 mg/dL (ref 0.50–1.35)
Glucose, Bld: 100 mg/dL — ABNORMAL HIGH (ref 70–99)
POTASSIUM: 4.1 meq/L (ref 3.5–5.3)
SODIUM: 138 meq/L (ref 135–145)
TOTAL PROTEIN: 7.2 g/dL (ref 6.0–8.3)
Total Bilirubin: 0.4 mg/dL (ref 0.2–1.2)

## 2013-09-24 LAB — LIPID PANEL
Cholesterol: 248 mg/dL — ABNORMAL HIGH (ref 0–200)
HDL: 38 mg/dL — ABNORMAL LOW (ref >39)
TRIGLYCERIDES: 521 mg/dL — AB (ref <150)
Total CHOL/HDL Ratio: 6.5 Ratio

## 2013-09-25 ENCOUNTER — Telehealth: Payer: Self-pay | Admitting: Family Medicine

## 2013-09-25 NOTE — Telephone Encounter (Signed)
Called pt to discuss his lab results. Cholesterol still high and likely needing treatment. AST and ALT twice the limit of normal - likely NASH but hepatitis also on ddx. Discussed that as he has had no care in last 3 years it would be good if he could come in to discuss whether we do more labo work and what we do to treat cholesterol based on ATP3 guidelines. Patient and wife requesting appt with PCP's team and want to see an attending.     Tasks:   - can you guys help me get him an appointment with someone on McDi[facility]'s team (I believe that is [facility] and Mauricio PoBreen???)? - they would prefer the latest possible appt in the day and it can be a month out if need me.   - would you mind calling him with the appt?

## 2013-09-28 NOTE — Telephone Encounter (Signed)
LM for patient or wife Calvin Johnston to call back.  Please help them get an appt scheduled for a follow up with a [employer] faculty.  Thanks. Jazmin Hartsell,CMA

## 2013-10-08 ENCOUNTER — Ambulatory Visit (INDEPENDENT_AMBULATORY_CARE_PROVIDER_SITE_OTHER): Payer: BC Managed Care – PPO | Admitting: Family Medicine

## 2013-10-08 ENCOUNTER — Encounter: Payer: Self-pay | Admitting: Family Medicine

## 2013-10-08 VITALS — BP 123/75 | HR 75 | Temp 98.7°F | Wt 202.5 lb

## 2013-10-08 DIAGNOSIS — G5621 Lesion of ulnar nerve, right upper limb: Secondary | ICD-10-CM

## 2013-10-08 DIAGNOSIS — B351 Tinea unguium: Secondary | ICD-10-CM

## 2013-10-08 DIAGNOSIS — G5620 Lesion of ulnar nerve, unspecified upper limb: Secondary | ICD-10-CM

## 2013-10-08 DIAGNOSIS — E785 Hyperlipidemia, unspecified: Secondary | ICD-10-CM

## 2013-10-08 DIAGNOSIS — R748 Abnormal levels of other serum enzymes: Secondary | ICD-10-CM

## 2013-10-08 MED ORDER — CICLOPIROX 8 % EX SOLN: Freq: Every day | CUTANEOUS | Status: DC

## 2013-10-08 NOTE — Patient Instructions (Signed)
  FATTY Liver  A summary of the efficacy and safety of statins for the treatment of non-alcoholic fatty liver disease in different clinical settings is reported in Table 4. Table 4. Summary of the recommendations for statin treatment in patients with non-alcoholic fatty liver disease. Long-term statin treatment and liver toxicity  Treatment with low-moderate intensity statins is safe. By contrast, high intensity statin treatment may induce liver toxicity.  Discourage routine liver biochemistry monitoring in asymptomatic individuals.  If ALT < 3xULN, continue therapy and re-check liver enzymes annually.  If values rise = 3xULN, stop statin or reduce dose and re-check within 4-6 weeks. Safety and efficacy of long-term statin treatment in patients with abnormal liver tests  Hepatologists are often consulted to advise referring physicians about the safety of prescribing statins in patients with elevated serum transaminases.  This abnormality is frequently secondary to associated comorbid conditions.  Statin treatment is safe and can improve liver tests. Statins for the treatment of dyslipidaemia in patients with non-alcoholic fatty liver disease  Atherogenic hyperlipidaemia is frequently associated with NAFLD.  Treatment of dyslipidaemia plays a critical role in the overall management of NAFLD.  The risk for serious liver injury from statins is quite rare and patients with NAFLD and hyperlipidaemia are not at increased risk for statin hepatotoxicity.  Previous recommendations advising against the use of statins in patients with dyslipidaemia and NAFLD are not evidence-based and should be reviewed. Statins for the treatment of non-alcoholic fatty liver disease and non-alcoholic steatohepatitis  Preliminary studies have shown that statins may possibly improve hepatic histology in patients with underlying NAFLD.  No convincing histological data are available.  At present, treatment with statins to cure  liver disease in patients with NAFLD is not recommended.  New RCTs of adequate size and duration are required to assess efficacy of statins for the treatment of NAFLD.
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
## 2013-10-09 ENCOUNTER — Telehealth: Payer: Self-pay | Admitting: Family Medicine

## 2013-10-09 ENCOUNTER — Telehealth: Payer: Self-pay | Admitting: *Deleted

## 2013-10-09 DIAGNOSIS — K760 Fatty (change of) liver, not elsewhere classified: Secondary | ICD-10-CM | POA: Insufficient documentation

## 2013-10-09 DIAGNOSIS — E7800 Pure hypercholesterolemia, unspecified: Secondary | ICD-10-CM | POA: Insufficient documentation

## 2013-10-09 DIAGNOSIS — G5621 Lesion of ulnar nerve, right upper limb: Secondary | ICD-10-CM

## 2013-10-09 HISTORY — DX: Lesion of ulnar nerve, right upper limb: G56.21

## 2013-10-09 HISTORY — DX: Fatty (change of) liver, not elsewhere classified: K76.0

## 2013-10-09 LAB — ACUTE HEP PANEL AND HEP B SURFACE AB
HCV AB: NEGATIVE
HEP A IGM: NONREACTIVE
HEP B S AB: NEGATIVE
Hep B C IgM: NONREACTIVE
Hepatitis B Surface Ag: NEGATIVE

## 2013-10-09 MED ORDER — ATORVASTATIN CALCIUM 20 MG PO TABS: 20.0000 mg | ORAL_TABLET | Freq: Every day | ORAL | Status: DC

## 2013-10-09 NOTE — Assessment & Plan Note (Signed)
Result reviewed and discussed with patient. Liver function test ordered as well as Hep B Ag to check for immunization status. Liver U/S recommended to assess for fatty Liver. I will call patient with result and recommendations.

## 2013-10-09 NOTE — Telephone Encounter (Signed)
Pt wife informed of ultrasound appt and of lab results. Princella Pellegrini Fleeger

## 2013-10-09 NOTE — Assessment & Plan Note (Signed)
Lab reviewed and discussed with patient. Cholesterol elevated, Triglyceride very high. LDL not calculated. 10 year ASCVD risk is 3.4%, patient on antihypertensive. Concern with elevated triglyceride Diet and exercise counseling done. Patient will benefit from Statin. Counseling done on Statin and Transaminates, risk of elevated liver enzyme is low with low to moderate dose of stating. I recommended Lipitor 20 mg qd + Fish oil. Repeat Liver Enzyme in 6 month and if stable may monitor yearly.

## 2013-10-09 NOTE — Assessment & Plan Note (Signed)
Conservative measures recommended. He might benefit from surgery since he already had hx of surgery of his left elbow. Advised to f/u with PCP if symptom persist or worsen for surgical referral.

## 2013-10-09 NOTE — Telephone Encounter (Signed)
Wife called and would like a call back from [place] and Dr. Lum Babe. She is at home and will be there when you call. jw

## 2013-10-09 NOTE — Telephone Encounter (Signed)
Message left to call back about test result, also his medication and been sent to his pharmacy.

## 2013-10-09 NOTE — Telephone Encounter (Signed)
LMOVM for pts wife to call back.  Pt originally requested an appt after 230pm, but will have to be NPO after midnight.  Need to know if he would prefer early morning or if he is ok with waiting that long without eating. Princella Pellegrini Patric Buckhalter

## 2013-10-09 NOTE — Assessment & Plan Note (Signed)
Ciclopirox prescribed.

## 2013-10-13 ENCOUNTER — Telehealth: Payer: Self-pay | Admitting: Family Medicine

## 2013-10-13 NOTE — Telephone Encounter (Signed)
Patient need nurse visit to start his hepatitis B series vaccination. Message left.

## 2013-10-14 ENCOUNTER — Ambulatory Visit
Admission: RE | Admit: 2013-10-14 | Discharge: 2013-10-14 | Disposition: A | Discharge status: Home / Self Care | Payer: BC Managed Care – PPO | Source: Ambulatory Visit | Attending: Family Medicine | Admitting: Family Medicine

## 2013-10-14 ENCOUNTER — Telehealth: Payer: Self-pay | Admitting: Family Medicine

## 2013-10-14 ENCOUNTER — Telehealth: Payer: Self-pay | Admitting: *Deleted

## 2013-10-14 DIAGNOSIS — R748 Abnormal levels of other serum enzymes: Secondary | ICD-10-CM

## 2013-10-14 NOTE — Telephone Encounter (Signed)
Message copied by Clovis Pu on Wed Oct 14, 2013  8:45 AM ------      Message from: Janit Pagan T      Created: Wed Oct 14, 2013  7:32 AM       Hi Tamika,            Patient need nurse appointment for Hep B series, please call to schedule appointment with you. Thank you.            Dr Lum Babe. ------

## 2013-10-14 NOTE — Telephone Encounter (Signed)
I called to discuss U/S, I spoke with his wife, U/S showed fatty Liver, we already discussed weight loss with patient and wife, I reiterated the need for him to lose weight and she stated they have started working on it, plan is to f/u with PCP in 3 months for reassessment. Schedule Hep B vaccination.

## 2013-10-14 NOTE — Telephone Encounter (Signed)
Appt for to start Hep B series Oct 16, 2013 at 4 PM.  Clovis Pu, RN

## 2013-10-16 ENCOUNTER — Ambulatory Visit (INDEPENDENT_AMBULATORY_CARE_PROVIDER_SITE_OTHER): Payer: BC Managed Care – PPO | Admitting: *Deleted

## 2013-10-16 DIAGNOSIS — Z23 Encounter for immunization: Secondary | ICD-10-CM

## 2013-11-13 ENCOUNTER — Ambulatory Visit: Payer: BC Managed Care – PPO

## 2013-11-16 ENCOUNTER — Ambulatory Visit (INDEPENDENT_AMBULATORY_CARE_PROVIDER_SITE_OTHER): Payer: BC Managed Care – PPO | Admitting: *Deleted

## 2013-11-16 DIAGNOSIS — Z23 Encounter for immunization: Secondary | ICD-10-CM

## 2013-12-06 ENCOUNTER — Other Ambulatory Visit: Payer: Self-pay | Admitting: Family Medicine

## 2014-02-05 ENCOUNTER — Ambulatory Visit: Payer: BC Managed Care – PPO

## 2014-03-06 ENCOUNTER — Other Ambulatory Visit: Payer: Self-pay | Admitting: Family Medicine

## 2014-03-08 NOTE — Telephone Encounter (Signed)
Left voice message informing pt that the refill for Lipitor was approved. He will need to schedule an office visit to check liver enzyme also before any other refills will be giving.  Clovis PuMartin, Tamika L, RN

## 2014-03-08 NOTE — Telephone Encounter (Signed)
Patient will need office visit for liver enzyme check before any further refills

## 2014-04-02 ENCOUNTER — Ambulatory Visit (INDEPENDENT_AMBULATORY_CARE_PROVIDER_SITE_OTHER): Payer: BC Managed Care – PPO | Admitting: Family Medicine

## 2014-04-02 ENCOUNTER — Ambulatory Visit: Payer: BC Managed Care – PPO | Admitting: Family Medicine

## 2014-04-02 ENCOUNTER — Encounter: Payer: Self-pay | Admitting: Family Medicine

## 2014-04-02 VITALS — BP 145/103 | HR 92 | Temp 99.0°F | Ht 65.75 in | Wt 204.3 lb

## 2014-04-02 DIAGNOSIS — R202 Paresthesia of skin: Secondary | ICD-10-CM

## 2014-04-02 DIAGNOSIS — B353 Tinea pedis: Secondary | ICD-10-CM | POA: Diagnosis not present

## 2014-04-02 DIAGNOSIS — R200 Anesthesia of skin: Secondary | ICD-10-CM

## 2014-04-02 DIAGNOSIS — R748 Abnormal levels of other serum enzymes: Secondary | ICD-10-CM | POA: Diagnosis not present

## 2014-04-02 DIAGNOSIS — I10 Essential (primary) hypertension: Secondary | ICD-10-CM | POA: Diagnosis not present

## 2014-04-02 DIAGNOSIS — G5601 Carpal tunnel syndrome, right upper limb: Secondary | ICD-10-CM | POA: Insufficient documentation

## 2014-04-02 LAB — COMPREHENSIVE METABOLIC PANEL
ALK PHOS: 81 U/L (ref 39–117)
ALT: 152 U/L — ABNORMAL HIGH (ref 0–53)
AST: 81 U/L — AB (ref 0–37)
Albumin: 4.5 g/dL (ref 3.5–5.2)
BUN: 18 mg/dL (ref 6–23)
CO2: 24 mEq/L (ref 19–32)
Calcium: 9.3 mg/dL (ref 8.4–10.5)
Chloride: 106 mEq/L (ref 96–112)
Creat: 0.91 mg/dL (ref 0.50–1.35)
GLUCOSE: 88 mg/dL (ref 70–99)
Potassium: 4.1 mEq/L (ref 3.5–5.3)
Sodium: 139 mEq/L (ref 135–145)
TOTAL PROTEIN: 7.1 g/dL (ref 6.0–8.3)
Total Bilirubin: 0.4 mg/dL (ref 0.2–1.2)

## 2014-04-02 MED ORDER — HYDROCHLOROTHIAZIDE 12.5 MG PO TABS: 25.0000 mg | ORAL_TABLET | Freq: Every day | ORAL | Status: DC

## 2014-04-02 NOTE — Progress Notes (Signed)
Patient ID: Calvin Johnston, male   DOB: 14-Jan-1976, 38 y.o.   MRN: [id number]    Subjective: CC: numbness in right fingers and bruising of right toes, lipitor refill HPI: Patient is a 38 y.o. male presenting to clinic today for elevated liver enzymes requiring repeat labs.  Patient is accompanied by his wife and daughter. Concerns today include:  1. Numbness in right fingers Patient states that he has had a longstanding (>1 year) h/o numbness in his right middle, ring and pinky fingers.  Patient has tried bracing during work for this with no relief.  He was given exercises to do at home in May for this and states that they are the same kind of things he does every day at work so he did not do them at home too.  He reports that tingling is not present at rest but becomes worse with hammering.  He works as a Actormason. He denies weakness, sensation changes anywhere else, or swelling  2. Bruising on right toes  Patient reports that he was seen here in may for this.  He states at that time, bruising was localized just to his right middle toe.  It has since spread to the neighboring big and third toes.  He has changed from steel toe boots, as this was thought to be the cause.  He denies trauma, bleeding from anywhere, bruising anywhere else.  He does admit to an ongoing fungal infection of his feet that he could not get oral antifungals for because of his fatty liver.  3. Elevated Liver enzymes Patient was found to have elevated liver enzymes and a fatty liver in May of this year. He normally is seen by Dr McDiarmid and was told in order to get refills on his Lipitor, that he would have to come in for lab testing.  He denies any muscle pain, abdominal pain, nausea or vomiting.  Denies drinking ETOH.  4. HTN Patient is noted to have elevated BP on exam.  Patient reports that he did not take his BP medication for over a week.  He states he attributes some swelling in his toes to this.  He denies CP, SOB, vision  changes, or headache.  Patient wants to switch to a different medication.  History Reviewed: non smoker  ROS: All other systems reviewed and are negative.  Objective: Office vital signs reviewed. BP 145/103 mmHg  Pulse 92  Temp(Src) 99 F (37.2 C) (Oral)  Ht 5' 5.75" (1.67 m)  Wt 204 lb 4.8 oz (92.67 kg)  BMI 33.23 kg/m2  Physical Examination:  General: Awake, alert, well appearing, well nourished, NAD. Wife and daughter in room. HEENT: Atraumatic, normocephalic, EOMI Abdomen: soft, NT/ND,+BS x4 Extremities: WWP, No edema, cyanosis or clubbing; +2 pulses bilaterally  R foot: diffuse milky white substance observed in the interdigits, Nail of great toe yellow and thickened with mild/moderate splitting.  Skin on soles of feet dry and peeling.  +thickending of skin and blue/purple discoloration of skin in the 1st, 2nd, and third digits,  Light touch sensation intact.  Strength intact.  R hand: negative phalens, negative reverse phalens, +tinels over carpal tunnel,  +tinels over cubital tunnel, +elbow flexion test, no ecchymosis, no edema MSK: Normal gait and station  Assessment: 38 y.o. male with  1. Numbness of the right 3-5 digits of the hand 2. Diffuse onchymycosis 3. H/o Elevated liver enzymes  4. HTN  Plan: See Problem List and After Visit Summary >25 mins of face to face time was  spent coordinating care for this patient.  Raliegh IpAshly M Gottschalk, DO PGY-1, [facility]

## 2014-04-02 NOTE — Patient Instructions (Signed)
It was a pleasure seeing you today!  Information regarding what we discussed is included in this packet.  Please feel free to call our office if any questions or concerns arise.  You can use Lamisil cream, Clotrimazole cream, or Tinactin spray daily for the fungus on your feet.  This is likely what your bruising is coming from. A referral to Sports medicine has been made for you.  They will call you to schedule an appointment. You are getting Liver function tests today.  We will contact you will these results and refill you cholesterol medicine as appropriate. Your blood pressure medication has been changed to Hydrochlorothiazide.  You should take this in the morning as it may increase urination.  Follow up with your PCP in 1 month.  Coriana Angello M. Aquilla Shambley, DO  Preventing Toenail Fungus from Recurring   Sanitize your shoes with Mycomist spray or a similar shoe sanitizer spray.  Follow the instructions on the bottle and dry them outside in the sun or with a hairdryer.  We also recommend repeating the sanitization once weekly in shoes you wear most often.   Throw away any shoes you have worn a significant amount without socks-fungus thrives in a warm moist environment and you want to avoid re-infection after your laser procedure   Bleach your socks with regular or color safe bleach   Change your socks regularly to keep your feet clean and dry (especially if you have sweaty feet)-if sweaty feet are a problem, let your doctor know-there is a great lotion that helps with this problem.   Clean your toenail clippers with alcohol before you use them if you do your own toenails and make sure to replace Emory boards and orange sticks regularly   If you get regular pedicures, bring your own instruments or go to a spa that sterilizes their instruments in an autoclave.

## 2014-04-02 NOTE — Assessment & Plan Note (Addendum)
No weakness. Cubital tunnel vs medial epicondylitis  -Referral to sports medicine -Recommend elbow brace at night time -NSAIDs PRN inflammation

## 2014-04-03 ENCOUNTER — Telehealth: Payer: Self-pay | Admitting: Family Medicine

## 2014-04-03 ENCOUNTER — Other Ambulatory Visit: Payer: Self-pay | Admitting: Family Medicine

## 2014-04-03 DIAGNOSIS — I10 Essential (primary) hypertension: Secondary | ICD-10-CM

## 2014-04-03 MED ORDER — HYDROCHLOROTHIAZIDE 25 MG PO TABS: 25.0000 mg | ORAL_TABLET | Freq: Every day | ORAL | Status: DC

## 2014-04-03 NOTE — Assessment & Plan Note (Signed)
BP 145/103 today.  Patient has not been compliant with medication over last week because of observed swelling in feet. -Stop Norvasc -Start HCTZ 25mg  qd -Patient advised to take in morning and hydrate well in the setting of his job (sweats at work). -Patient counseled on red flags of HTN and electrolyte imbalance. -Patient voices good understanding and is advised to follow up in 1-4 weeks for BP follow up as his work permits.

## 2014-04-03 NOTE — Telephone Encounter (Signed)
I have rewritten the RX for the proper dosage tablet and qty and resubmitted to pharmacy.  Thanks so much for bringing that to my attention.

## 2014-04-03 NOTE — Assessment & Plan Note (Addendum)
Last CMP reviewed from 09/2013.  Last note reviewed from Dr Lum BabeEniola.  Current ASCVD risk 69%, which would require a high dose statin. -CMP ordered -Will contact patient with results and any changes to medication regimen -Will consider using Pravastatin for cardiac protection in a patient with fatty liver.  (as outlined in UpToDate) -Patient verbally states that he wishes to have information regarding lab results and any changes to medicines given to his wife if he is unavailable.

## 2014-04-03 NOTE — Telephone Encounter (Signed)
Pts wife called the afterhours line to discuss the prescritpion she was given for HCTZ. She was told to give him 2 tabs a day (25mg  total), but there are only 30 tabs in the bottle, not equaling a months worth of medication. By OV note it is unclear of the plan for the changes in medication dose. I believe he is to take 2 tabs daily, but the amount was incorrect and should have been ordered 60 tabs. I told her to have him take 2 tabs a day and monitor BP. If he becomes dizzy or has low BP, then he should cut back on the dosage.  - I will forward this to the ordering physician to address on Monday.  Felix PaciniKuneff, Renee DO PGY-3 [facility]

## 2014-04-03 NOTE — Assessment & Plan Note (Signed)
Patient has ongoing h/o tinea pedis and onchomycosis.  This is likely why there is discoloration of his R 1-3 toes.  Because of elevated LFTs oral antifungals are not appropriate. -Recommend that patient use OTC Lamisil, Clotrimazole, or Tinactin daily.  Patient acknowledges that he will need to use these treatments will take several months before he sees any difference. -Patient has been counseled on good foot hygiene. -Patient to f/u with PCP PRN

## 2014-04-05 ENCOUNTER — Encounter: Payer: Self-pay | Admitting: Family Medicine

## 2014-04-05 ENCOUNTER — Other Ambulatory Visit: Payer: Self-pay | Admitting: Family Medicine

## 2014-04-05 DIAGNOSIS — E785 Hyperlipidemia, unspecified: Secondary | ICD-10-CM

## 2014-04-05 DIAGNOSIS — R748 Abnormal levels of other serum enzymes: Secondary | ICD-10-CM

## 2014-04-05 MED ORDER — ATORVASTATIN CALCIUM 10 MG PO TABS: 10.0000 mg | ORAL_TABLET | Freq: Every day | ORAL | Status: DC

## 2014-04-05 NOTE — Progress Notes (Signed)
Per Dr McDiarmid.  Decreased Lipitor to 10mg .  Repeat Lipid panel and LFTs in 4-6 weeks.   Future orders placed for Lipid panel and CMP (LFTs plus electrolytes in the setting of recent start on HCTZ).  Goal TG's<400   Ambrie Carte M. Nadine CountsGottschalk, DO

## 2014-04-21 ENCOUNTER — Encounter: Payer: Self-pay | Admitting: Family Medicine

## 2014-04-21 ENCOUNTER — Ambulatory Visit (INDEPENDENT_AMBULATORY_CARE_PROVIDER_SITE_OTHER): Payer: BC Managed Care – PPO | Admitting: Family Medicine

## 2014-04-21 VITALS — BP 152/92 | HR 71 | Temp 98.0°F | Wt 200.0 lb

## 2014-04-21 DIAGNOSIS — R21 Rash and other nonspecific skin eruption: Secondary | ICD-10-CM

## 2014-04-21 NOTE — Progress Notes (Signed)
Patient ID: Calvin Johnston, male   DOB: 1975-07-08, 38 y.o.   MRN: [id number]  HPI:  Pt presents for a same day appointment to discuss rash.  About one month ago began to notice rash on his R leg. Does not itch or hurt. A day or two ago noticed it spreading to his L leg. No one else has the rash. No fevers, new medicines or foods. No lesions in mouth or genitals.    ROS: See HPI  PMFSH: hx tinea pedis, abnormal LFT's, obesity, BPPV, HTN, HLD  PHYSICAL EXAM: BP 152/92 mmHg  Pulse 71  Temp(Src) 98 F (36.7 C) (Oral)  Wt 200 lb (90.719 kg) Gen: NAD HEENT: NCAT, MMM without exudates or lesions Neuro: grossly nonfocal, speech normal Skin: erythematous patchy rash over right shin consisting of approximately 5-6 nickel-sized erythematous circles circumscribed by small petechial area. Lesions do not blanch. No skin breakdown or drainage. A few similar spots on L shin.  Skin Punch Biopsy:  Consent obtained and time out performed. Area of L shin rash cleaned with alcohol and 2 ml of 1% lidocaine with epi injected in the skin. Then area cleaned with betadine.  Then a 4 mm punch biopsy used to remove a portion of the lesion. The circle of skin was then undermined with scissors and removed with forceps. A steristrip was then applied, followed by a dressing. No bleeding, and the patient did well.   ASSESSMENT/PLAN:  1. Rash - etiology not clear by history or exam. Punch biopsy performed today to further evaluate. Pt will also return tomorrow for lab draw to check CBC, CMET, sed rate, and HIV. Precepted with Dr. Jennette KettleNeal who also examined patient and agrees with this plan.   FOLLOW UP: F/u in a few weeks with PCP for chronic issues (appt already scheduled).  GrenadaBrittany J. Pollie MeyerMcIntyre, MD [facility]

## 2014-04-21 NOTE — Patient Instructions (Signed)
Keep steri strip on as long as it will stay Keep area clean and dry Return if lots of drainage, fevers, redness, or any concerns about the biopsy site  Come back tomorrow at 3:45pm to get your bloodwork.  Nice to meet you! Happy Holidays!  Dr. Pollie MeyerMcIntyre

## 2014-04-22 ENCOUNTER — Other Ambulatory Visit: Payer: BC Managed Care – PPO

## 2014-04-22 DIAGNOSIS — R21 Rash and other nonspecific skin eruption: Secondary | ICD-10-CM

## 2014-04-22 DIAGNOSIS — E785 Hyperlipidemia, unspecified: Secondary | ICD-10-CM

## 2014-04-22 NOTE — Progress Notes (Signed)
FLP,CMP,HIV,ESR AND CBC DONE TODAY [name]

## 2014-04-23 LAB — LIPID PANEL
Cholesterol: 166 mg/dL (ref 0–200)
HDL: 39 mg/dL — AB (ref >39)
LDL Cholesterol: 71 mg/dL (ref 0–99)
TRIGLYCERIDES: 282 mg/dL — AB (ref <150)
Total CHOL/HDL Ratio: 4.3 Ratio
VLDL: 56 mg/dL — AB (ref 0–40)

## 2014-04-23 LAB — COMPREHENSIVE METABOLIC PANEL
ALT: 175 U/L — ABNORMAL HIGH (ref 0–53)
AST: 80 U/L — ABNORMAL HIGH (ref 0–37)
Albumin: 3.9 g/dL (ref 3.5–5.2)
Alkaline Phosphatase: 75 U/L (ref 39–117)
BILIRUBIN TOTAL: 0.5 mg/dL (ref 0.2–1.2)
BUN: 18 mg/dL (ref 6–23)
CO2: 28 mEq/L (ref 19–32)
Calcium: 9.8 mg/dL (ref 8.4–10.5)
Chloride: 101 mEq/L (ref 96–112)
Creat: 0.92 mg/dL (ref 0.50–1.35)
Glucose, Bld: 108 mg/dL — ABNORMAL HIGH (ref 70–99)
Potassium: 4.1 mEq/L (ref 3.5–5.3)
Sodium: 138 mEq/L (ref 135–145)
Total Protein: 6.8 g/dL (ref 6.0–8.3)

## 2014-04-23 LAB — CBC
HEMATOCRIT: 45.8 % (ref 39.0–52.0)
HEMOGLOBIN: 15.7 g/dL (ref 13.0–17.0)
MCH: 30.2 pg (ref 26.0–34.0)
MCHC: 34.3 g/dL (ref 30.0–36.0)
MCV: 88.1 fL (ref 78.0–100.0)
MPV: 10.6 fL (ref 9.4–12.4)
Platelets: 240 10*3/uL (ref 150–400)
RBC: 5.2 MIL/uL (ref 4.22–5.81)
RDW: 14 % (ref 11.5–15.5)
WBC: 5.8 10*3/uL (ref 4.0–10.5)

## 2014-04-23 LAB — SEDIMENTATION RATE: Sed Rate: 1 mm/hr (ref 0–16)

## 2014-04-23 LAB — HIV ANTIBODY (ROUTINE TESTING W REFLEX): HIV 1&2 Ab, 4th Generation: NONREACTIVE

## 2014-04-26 ENCOUNTER — Encounter: Payer: Self-pay | Admitting: Sports Medicine

## 2014-04-26 ENCOUNTER — Ambulatory Visit (INDEPENDENT_AMBULATORY_CARE_PROVIDER_SITE_OTHER): Payer: BC Managed Care – PPO | Admitting: Sports Medicine

## 2014-04-26 VITALS — BP 138/89 | HR 96 | Ht 65.0 in | Wt 202.0 lb

## 2014-04-26 DIAGNOSIS — R202 Paresthesia of skin: Secondary | ICD-10-CM | POA: Diagnosis not present
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
## 2014-05-06 ENCOUNTER — Encounter: Payer: Self-pay | Admitting: Family Medicine

## 2014-05-06 ENCOUNTER — Ambulatory Visit (INDEPENDENT_AMBULATORY_CARE_PROVIDER_SITE_OTHER): Payer: BC Managed Care – PPO | Admitting: Family Medicine

## 2014-05-06 VITALS — BP 129/82 | HR 97 | Temp 98.4°F | Ht 65.0 in | Wt 201.0 lb

## 2014-05-06 DIAGNOSIS — G5621 Lesion of ulnar nerve, right upper limb: Secondary | ICD-10-CM

## 2014-05-06 DIAGNOSIS — Z79899 Other long term (current) drug therapy: Secondary | ICD-10-CM | POA: Diagnosis not present

## 2014-05-06 DIAGNOSIS — R7309 Other abnormal glucose: Secondary | ICD-10-CM

## 2014-05-06 DIAGNOSIS — R748 Abnormal levels of other serum enzymes: Secondary | ICD-10-CM

## 2014-05-06 DIAGNOSIS — L817 Pigmented purpuric dermatosis: Secondary | ICD-10-CM

## 2014-05-06 DIAGNOSIS — Z23 Encounter for immunization: Secondary | ICD-10-CM | POA: Diagnosis not present

## 2014-05-06 DIAGNOSIS — R7303 Prediabetes: Secondary | ICD-10-CM

## 2014-05-06 DIAGNOSIS — E669 Obesity, unspecified: Secondary | ICD-10-CM

## 2014-05-06 DIAGNOSIS — E785 Hyperlipidemia, unspecified: Secondary | ICD-10-CM

## 2014-05-06 DIAGNOSIS — R739 Hyperglycemia, unspecified: Secondary | ICD-10-CM

## 2014-05-06 DIAGNOSIS — I10 Essential (primary) hypertension: Secondary | ICD-10-CM

## 2014-05-06 LAB — COMPREHENSIVE METABOLIC PANEL
ALT: 197 U/L — ABNORMAL HIGH (ref 0–53)
AST: 88 U/L — ABNORMAL HIGH (ref 0–37)
Albumin: 4.4 g/dL (ref 3.5–5.2)
Alkaline Phosphatase: 66 U/L (ref 39–117)
BUN: 19 mg/dL (ref 6–23)
CALCIUM: 9.3 mg/dL (ref 8.4–10.5)
CHLORIDE: 104 meq/L (ref 96–112)
CO2: 24 meq/L (ref 19–32)
Creat: 0.93 mg/dL (ref 0.50–1.35)
Glucose, Bld: 87 mg/dL (ref 70–99)
Potassium: 4.2 mEq/L (ref 3.5–5.3)
Sodium: 139 mEq/L (ref 135–145)
Total Bilirubin: 0.6 mg/dL (ref 0.2–1.2)
Total Protein: 6.9 g/dL (ref 6.0–8.3)

## 2014-05-06 LAB — TSH: TSH: 0.355 u[IU]/mL (ref 0.350–4.500)

## 2014-05-06 LAB — POCT GLYCOSYLATED HEMOGLOBIN (HGB A1C): HEMOGLOBIN A1C: 5.6

## 2014-05-06 NOTE — Progress Notes (Signed)
Pt states that he got his Hep B and Flu @ a pharmacy nut he is not sure where.  He will have his wife bring immunization record in. Audreana Hancox, Maryjo RochesterJessica Dawn

## 2014-05-06 NOTE — Patient Instructions (Signed)
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  It happens in people who stand up a lot for work.   This is a lifelong  Condition that will flare up intermittently.  I would recommend you wear support hose when you are at work.  You can pick up support hose at most Medical [employer] shops.  Look for support hose that provide between 10 to 15 millimeter mercury compression.    If the rash starts itching, start using over-the-counter hydrocortisone cream twice daily  on the rash until the itching stops.  Your blood sugar was up slightly on your recent blood tests. We are testing you for Diabetes Mellitus today.   Dr Cariann Kinnamon would recommend about 10 pounds of weight loss.  Consider stopping sodas and Gatorade and Juices.  Water exercise and biking would be easy on your knees.

## 2014-05-07 ENCOUNTER — Encounter: Payer: Self-pay | Admitting: Family Medicine

## 2014-05-07 DIAGNOSIS — R7303 Prediabetes: Secondary | ICD-10-CM | POA: Insufficient documentation

## 2014-05-07 DIAGNOSIS — L817 Pigmented purpuric dermatosis: Secondary | ICD-10-CM | POA: Insufficient documentation

## 2014-05-07 HISTORY — DX: Pigmented purpuric dermatosis: L81.7

## 2014-05-07 NOTE — Assessment & Plan Note (Signed)
Adequate blood pressure control.  No evidence of new end organ damage.  Tolerating medication without significant adverse effects.  Plan to continue current HCTZ.  Will consider addition of ACEI / ARB if progression to DMT@.

## 2014-05-07 NOTE — Assessment & Plan Note (Addendum)
Lab Results  Component Value Date   LDLCALC 71 04/22/2014  Results for Calvin Johnston, Calvin Johnston (MRN [id number]) as of 05/07/2014 15:26  Ref. Range 05/06/2014 12:04  AST Latest Range: 0-37 U/L 88 (H)  ALT Latest Range: 0-53 U/L 197 (H)  Results for Calvin Johnston, Calvin Johnston (MRN [id number]) as of 05/07/2014 15:26  Ref. Range 05/06/2014 12:04  Total Bilirubin Latest Range: 0.2-1.2 mg/dL 0.6  Results for Calvin Johnston, Calvin Johnston (MRN [id number]) as of 05/07/2014 15:26  Ref. Range 05/06/2014 12:04  Alkaline Phosphatase Latest Range: 39-117 U/L 66  Results for Calvin Johnston, Calvin Johnston (MRN [id number]) as of 05/07/2014 15:26  Ref. Range 05/06/2014 12:04  Albumin Latest Range: 3.5-5.2 g/dL 4.4    Patient taking atorvastatin 10 mg daily.  Adequate LDL control.  No significant change in LFTs with reduction in atorvastatin dose to 10 mg daily. Given potential benefit from atorvastatin in NASH will recommend patient remain on atorvastatin 10 mg daily.

## 2014-05-07 NOTE — Assessment & Plan Note (Signed)
Lab Results  Component Value Date   HGBA1C 5.6 05/06/2014   Obesity, ethnicity and first degree relative with diabetes places patient at high risk for progression to diabetes mellitus type II Patient declined nutritionist consultation at this time.  Recommended 5-75 weight loss Weekly aerobic exercise of at least 150 minutes per week Reduction in simple sugars and fats.  Recheck A1c if significant change in lifestyle or in 6 to 12 months for surveillance.

## 2014-05-07 NOTE — Assessment & Plan Note (Signed)
See Prediabetes A/P for today.

## 2014-05-07 NOTE — Assessment & Plan Note (Signed)
New diagnosis A hypersensitive skin process common in people who stand a lot for work.  Lifelong condition with intermittent flare ups.  Recommended Support hose (10 - 15 mm Hg compression) knee high when at work. If itching develops, then treat with topical mild to moderate potency steroids until resolves, then apply steroid cream once or twice a week to areas prone to flare ups to limit exacerbations.

## 2014-05-07 NOTE — Progress Notes (Signed)
   Subjective:    Patient ID: Calvin Johnston, male    DOB: Nov 19, 1975, 38 y.o.   MRN: [id number]  HPI Problem List Items Addressed This Visit      Unprioritized   Hypertension CHRONIC HYPERTENSION  Disease Monitoring  Blood pressure range: not checking at home  Chest pain: no   Dyspnea: no   Claudication: no   Medication compliance: yes, taking HCTZ 25 mg daily   Medication Side Effects  Lightheadedness: no   Urinary frequency: no   Edema: no     Preventitive Healthcare:  Exercise: no   Diet Pattern: eating fatty foods, sodas, juice  Salt Restriction: no     Hyperlipidemia - Longstanding issue - taking lipitor 10 mg daily.  His lipitor was decreased from 20 mg daily because of slight increase in asymptomatic LFTs.   Elevated liver enzymes - Prior workup this year showed only fatty liver on US with negative viral hepatitis work up.  - On Lipitor. Recent reduction in dose.  - Takes APAP less than once a week.  - Deneis jaundice, abdominal pain, clay colored stools.       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  Present on both legs.  No change in rash over last year.  - No fever.  No immunosuppressing conditions. - Working a Scientist, water qualitybrick mason.   Hyperglycemia - Hyperglycemia found on recent random BMP - Denies polyuria, polydipsia - Needs reading glasses but no blurry vision - (+) slow weight gain over last several years. 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  - High fat and simple sugars diet.  - Mother with diabetes.     No smoking  Review of Systems  See HPI No fever      Objective:   Physical Exam VS reviewed GEN: Alert, Cooperative, Groomed, NAD COR: RRR, No M/G/R, No JVD, Normal PMI size and location LUNGS: BCTA, No Acc mm use, speaking in full sentences ABDOMEN: (+)BS, soft, NT, ND, No HSM, No  palpable masses  SKIN: Several macular lesions on bilateral shins, R>L, surronding by "cayenne pepper" non-blanching petechiae.  No evidence of excoriation.   Feet: scaling skin of feet dorsum bilaterally.  Darkened background skin of 2nd digit.   Diabetic Foot Check -  Appearance - no lesions, (+) generalized calluses on forefeet plantar surfaces and heels. Thicken nails x 10.   Skin - no unusual pallor or redness Monofilament testing -  Right - Great toe, medial, central, lateral ball and posterior foot intact Left - Great toe, medial, central, lateral ball and posterior foot intact       Assessment & Plan:

## 2014-05-07 NOTE — Assessment & Plan Note (Signed)
Stably elevated LFTs without evidence of obstructive labs pattern. No symptoms of hepatitis. Patient drinks alcohol (beer) once a year in celebration.  Prior Workup with viral hepatitis titers and ultrasound imaging show only fatty liver.  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   Recommend weight reduction of 5 to 7 percent.  See Prediabetes above.  Repeat LFTs next visit.

## 2014-05-10 ENCOUNTER — Encounter: Payer: Self-pay | Admitting: Family Medicine

## 2014-05-10 ENCOUNTER — Telehealth: Payer: Self-pay | Admitting: Family Medicine

## 2014-05-10 NOTE — Telephone Encounter (Signed)
I spoke with patient's wife, Calvin Johnston concerning Mr Calvin Johnston' lab results  1.  Informed that patient is nearly in Prediabetes condition, A1c 5.6%.  Recommended that he aim for a 5 to 7% weight reduction down to around 188 lbs along with >= 150 minutes of aerobic exercise a week.  2.  RTC in 3 months to follow up weight, lifestyle changes, A1c, and LFT  3. Informed patient of diagnosis of NAFLD.  Periodic surveillance for cirrosis planned. Persistent elevated PFTs    4. Recommend continue atorvastatin 10 mg daily as it has been associated with improved outcomes in NAFLD.

## 2014-05-30 ENCOUNTER — Other Ambulatory Visit: Payer: Self-pay | Admitting: Family Medicine

## 2014-06-07 ENCOUNTER — Ambulatory Visit: Payer: Self-pay | Admitting: Sports Medicine

## 2014-06-11 ENCOUNTER — Other Ambulatory Visit: Payer: Self-pay | Admitting: Family Medicine

## 2014-06-16 ENCOUNTER — Ambulatory Visit: Payer: Self-pay | Admitting: Sports Medicine

## 2014-06-30 ENCOUNTER — Ambulatory Visit: Payer: Self-pay | Admitting: Sports Medicine

## 2014-07-16 ENCOUNTER — Ambulatory Visit: Payer: Self-pay | Admitting: Family Medicine

## 2014-07-19 ENCOUNTER — Ambulatory Visit: Payer: Self-pay | Admitting: Family Medicine

## 2014-07-23 ENCOUNTER — Ambulatory Visit (INDEPENDENT_AMBULATORY_CARE_PROVIDER_SITE_OTHER): Payer: BLUE CROSS/BLUE SHIELD | Admitting: Family Medicine

## 2014-07-23 ENCOUNTER — Encounter: Payer: Self-pay | Admitting: Family Medicine

## 2014-07-23 VITALS — BP 136/82 | HR 73 | Ht 65.0 in | Wt 200.0 lb

## 2014-07-23 DIAGNOSIS — G5621 Lesion of ulnar nerve, right upper limb: Secondary | ICD-10-CM

## 2014-07-23 DIAGNOSIS — G5601 Carpal tunnel syndrome, right upper limb: Secondary | ICD-10-CM

## 2014-07-23 MED ORDER — TRIAMCINOLONE ACETONIDE 10 MG/ML IJ SUSP
10.0000 mg | Freq: Once | INTRAMUSCULAR | Status: AC
  Administered 2014-07-23: 10 mg via INTRA_ARTICULAR

## 2014-07-23 NOTE — Progress Notes (Signed)
Calvin Johnston - 39 y.o. male MRN [id number]  Date of birth: [birth year]  SUBJECTIVE:  Including CC & ROS.  Patient is a 39 year old gentleman following up today to discuss his EMG results of his right upper extremity.  Patient was last seen in December 2015 for evaluation of right hand numbness are typically with work. 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 He notices after holding and equal for more than a few minutes he develops significant numbness in his hand particularly in the third and fourth digit. Usually after taking a break for a few minutes the pain and numbness resolved. Denied any numbness at night. Did not have any significant clinical improvement from the prednisone taper 6 months ago. Because of a remote history of C2 fracture 8 years ago with previous left side ulnar nerve entrapment that was posttraumatic there was concern for possible other areas of pathology for this patient other than the carpal tunnel.  EMG done on 05/24/14 revealed moderate to severe chronic right median nerve compression. No evidence of proximal nerve lesion her ulnar nerve compression.  Patient reports today that he continues have similar symptoms with no center in changes in his previous history of present illness.   ROS: Review of systems otherwise negative except for information present in HPI  HISTORY: Past Medical, Surgical, Social, and Family History Reviewed & Updated per EMR. Pertinent Historical Findings include: Hypertension Closed C2 cervical fracture Prior left-sided ulnar nerve neuropathy posttraumatic requiring surgical transposition Hyperlipidemia Nonsmoker Family history of diabetes and hyperlipidemia  DATA REVIEWED: Reviewed patient's EMG  PHYSICAL EXAM:  VS: BP:136/82 mmHg  HR:73bpm  TEMP: ( )  RESP:   HT:5\' 5"  (165.1 cm)   WT:200 lb (90.719 kg)  BMI:33.4 WRIST EXAM: General: well nourished Skin of UE: warm; dry, no rashes, lesions, ecchymosis  or erythema. Vascular: radial pulses 2+ bilaterally Neurologically: Sensation to light touch upper extremities equal and intact bilaterally.  Observation: No evidence of edema, swelling and ecchymosis Palpation:No tenderness to palpation over the metatarsals, found X, carpal bones, or distal radius or ulna. No tenderness over the anatomical snuffbox. Range of motion: Full range of motion of the wrist and wrist flexion, extension, supination, pronation.   Normal range of motion with no pain at the MCP, PIP or, and DIP joints Muscle strength: No weakness or pain with intact flexor digitorum superficialis and profundus.  No pain or weakness with wrist flexion of the flexor carpi ulnaris and radialis 5/5 strength.  Neurologic: Normal sensation C5-T1, normal deep tendon reflexes at the brachial radialis and triceps.  Normal capillary refill Special tests: Negative squeeze test at the forearm, normal elbow valgus and varus stress  Positive Tinel's and positive Phalen's test, and carpal tunnel compression all exacerbation patient numbness and tingling  MSK US: Ultrasound exam of carpal tunnel reveals compression of the median nerve under the flexor retinaculum with hand and finger flexion.  ASSESSMENT & PLAN: See problem based charting & AVS for pt instructions. 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 Although his symptoms are not typical it appears that majorities his symptoms are functional related to work. Given that he is not planning on changing jobs soon this will likely progress if not intervened. -Offer patient therapeutic relief with a carpal tunnel injection today which he was interested in  for symptomatic relief. -Discussed options for surgical intervention given the severity of EMG. Patient and family are interested  in referral. He was referred to Dr. Richardson Landry.  The patient's clinical condition is marked by substantial pain and/or significant functional disability. Other conservative therapy has not provided relief, is contraindicated, or not appropriate. There is a reasonable likelihood that cortisone injection will signicantly improve the patient's pain and/or functional disability.  Injection procedure: Consent obtained and verified. Sterile betadine prep. Furthur cleansed with alcohol and betadine. Topical analgesic spray: Ethyl chloride. Injection Indication: carpal tunnel syndrome Approached in typical fashion with: Using ultrasound guidance, a 5/8 inch 30-gauge needle was injected out of plane into the carpal tunnel on the medial aspect of the ulnar nerve to provide a hydrodissection Meds: 1 mL of 10 mg Kenalog and 1 mL of 1% lidocaine Needle: 5/8 inch 30-gauge Completed without difficulty Aftercare instructions and Red flags advised. Advised to call if fevers/chills, erythema, induration, drainage, or persistent bleeding.

## 2014-07-27 ENCOUNTER — Encounter: Payer: Self-pay | Admitting: Family Medicine

## 2014-08-06 ENCOUNTER — Other Ambulatory Visit: Payer: Self-pay | Admitting: Family Medicine

## 2014-08-10 ENCOUNTER — Other Ambulatory Visit: Payer: Self-pay | Admitting: *Deleted

## 2014-08-10 DIAGNOSIS — G5601 Carpal tunnel syndrome, right upper limb: Secondary | ICD-10-CM

## 2014-08-20 DIAGNOSIS — G5601 Carpal tunnel syndrome, right upper limb: Secondary | ICD-10-CM

## 2014-08-20 HISTORY — DX: Carpal tunnel syndrome, right upper limb: G56.01

## 2014-08-30 ENCOUNTER — Other Ambulatory Visit: Payer: Self-pay | Admitting: Physician Assistant

## 2014-09-02 ENCOUNTER — Encounter (HOSPITAL_BASED_OUTPATIENT_CLINIC_OR_DEPARTMENT_OTHER): Payer: Self-pay | Admitting: *Deleted

## 2014-09-02 NOTE — Pre-Procedure Instructions (Signed)
To come for BMET and EKG 

## 2014-09-03 ENCOUNTER — Other Ambulatory Visit: Payer: Self-pay

## 2014-09-03 ENCOUNTER — Encounter (HOSPITAL_BASED_OUTPATIENT_CLINIC_OR_DEPARTMENT_OTHER)
Admission: RE | Admit: 2014-09-03 | Discharge: 2014-09-03 | Disposition: A | Discharge status: Home / Self Care | Payer: BLUE CROSS/BLUE SHIELD | Source: Ambulatory Visit | Attending: Orthopedic Surgery | Admitting: Orthopedic Surgery

## 2014-09-03 DIAGNOSIS — Z01812 Encounter for preprocedural laboratory examination: Secondary | ICD-10-CM | POA: Insufficient documentation

## 2014-09-03 DIAGNOSIS — I10 Essential (primary) hypertension: Secondary | ICD-10-CM | POA: Diagnosis not present

## 2014-09-03 DIAGNOSIS — Z79899 Other long term (current) drug therapy: Secondary | ICD-10-CM | POA: Diagnosis not present

## 2014-09-03 DIAGNOSIS — Z01810 Encounter for preprocedural cardiovascular examination: Secondary | ICD-10-CM | POA: Diagnosis not present

## 2014-09-03 DIAGNOSIS — G5601 Carpal tunnel syndrome, right upper limb: Secondary | ICD-10-CM | POA: Insufficient documentation

## 2014-09-03 LAB — BASIC METABOLIC PANEL
Anion gap: 9 (ref 5–15)
BUN: 18 mg/dL (ref 6–23)
CALCIUM: 9.1 mg/dL (ref 8.4–10.5)
CHLORIDE: 102 mmol/L (ref 96–112)
CO2: 27 mmol/L (ref 19–32)
Creatinine, Ser: 0.88 mg/dL (ref 0.50–1.35)
GFR calc Af Amer: >90 mL/min (ref >90)
GFR calc non Af Amer: >90 mL/min (ref >90)
GLUCOSE: 154 mg/dL — AB (ref 70–99)
Potassium: 3.9 mmol/L (ref 3.5–5.1)
Sodium: 138 mmol/L (ref 135–145)

## 2014-09-03 NOTE — Progress Notes (Signed)
EKG [facility] Dr Ren[facility] Don[place]eroth[facility]

## 2014-09-07 NOTE — H&P (Signed)
[facility] [address]   [address] , [place] [address] [phone] A Division of [facility]  Loreta Ave, M.D.   Robert A. Thurston Hole, M.D.   Burnell Blanks, M.D.   Eulas Post, M.D.   Lunette Stands, M.D Jewel Baize. Eulah Pont, M.D.  Buford Dresser, M.D.  Estell Harpin, M.D.    Melina Fiddler, M.D. Janalee Dane, PA- C  Mary L. Dub Mikes, PA-C  Kirstin A. Shepperson, PA-C  Josh [place], PA-C  [place], [place]   RE: Calvin Johnston, Calvin Johnston                                [id number]      DOB: Feb 15, 1976 PROGRESS NOTE: 08-17-14 This is a 39 year-old gentleman, new patient, Ecologist, who presents to our clinic with right hand pain.  Calvin Johnston states he has been having issues with his right hand for the past year or so.  He admits to increased numbness and tingling to the 3rd and 5th digits.  This has been keeping him up at night.  Of note, Calvin Johnston works as a Scientist, water quality and this seems to really aggravate his symptoms.  Really no signs of ulnar compression at this time.  Calvin Johnston was most recently seen in January of this year by [facility] where an EMG was ordered.  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  No evidence of a more proximal median nerve lesion or ulnar compressive lesion.  Calvin Johnston followed back up with [facility] in March of this past year where he had an ultrasound guided carpal tunnel injection.  He states this has significantly helped his symptoms.  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  He therefore would like to proceed with surgical intervention.   Past medical history: Significant for high blood pressure, cholesterol and increased liver enzymes.  Negative for  diabetes.   Current medications: None listed.   Allergies: Codeine. Family history: Significant for diabetes, high blood pressure and arthritis.   Social history: Does not smoke or drink alcoholic beverages.  He is married, but works as a Actor.      EXAMINATION: Well-developed, well-nourished male in no acute distress.  Height: 5?5.  Weight: 205 pounds. Examination of his right wrist reveals no thenar atrophy.  Negative Phalen's.  Negative Tinel's.  Decreased sensation in the 3rd and 5th digits.   IMPRESSION: Moderate to severe carpal tunnel syndrome, right hand.   PLAN: At this point there is nothing short of carpal tunnel release that is going to relieve Calvin Johnston's symptoms.  We are going to proceed with a right carpal tunnel release at this point in time.  Risks, benefits and possible complications of surgery have been reviewed.  Rehab and recovery time discussed.  All questions answered.  We have told Calvin Johnston he will be out for two weeks following surgery, while the wound is healing.  At that point we can release him to work with restrictions.  We have told him it will be a good six weeks before we can release him to full activity.  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  We will see Calvin Johnston at the time of operative intervention.    Loreta Aveaniel F. Jaelah Hauth, M.D.   Electronically verified by Loreta Aveaniel F. Ethen Bannan, M.D. DFM(LS):jjh D 08-18-14 T 08-19-14

## 2014-09-09 ENCOUNTER — Encounter (HOSPITAL_BASED_OUTPATIENT_CLINIC_OR_DEPARTMENT_OTHER)
Admission: RE | Disposition: A | Discharge status: Home / Self Care | Payer: Self-pay | Source: Ambulatory Visit | Attending: Orthopedic Surgery

## 2014-09-09 ENCOUNTER — Ambulatory Visit (HOSPITAL_BASED_OUTPATIENT_CLINIC_OR_DEPARTMENT_OTHER)
Admission: RE | Admit: 2014-09-09 | Discharge: 2014-09-09 | Disposition: A | Discharge status: Home / Self Care | Payer: BLUE CROSS/BLUE SHIELD | Source: Ambulatory Visit | Attending: Orthopedic Surgery | Admitting: Orthopedic Surgery

## 2014-09-09 ENCOUNTER — Encounter (HOSPITAL_BASED_OUTPATIENT_CLINIC_OR_DEPARTMENT_OTHER): Payer: Self-pay | Admitting: Anesthesiology

## 2014-09-09 ENCOUNTER — Ambulatory Visit (HOSPITAL_BASED_OUTPATIENT_CLINIC_OR_DEPARTMENT_OTHER): Payer: BLUE CROSS/BLUE SHIELD | Admitting: Anesthesiology

## 2014-09-09 DIAGNOSIS — G5601 Carpal tunnel syndrome, right upper limb: Secondary | ICD-10-CM | POA: Insufficient documentation

## 2014-09-09 DIAGNOSIS — Z79899 Other long term (current) drug therapy: Secondary | ICD-10-CM | POA: Insufficient documentation

## 2014-09-09 DIAGNOSIS — I10 Essential (primary) hypertension: Secondary | ICD-10-CM | POA: Insufficient documentation

## 2014-09-09 HISTORY — DX: Carpal tunnel syndrome, right upper limb: G56.01

## 2014-09-09 HISTORY — DX: Nausea with vomiting, unspecified: R11.2

## 2014-09-09 HISTORY — DX: Nausea with vomiting, unspecified: Z98.890

## 2014-09-09 HISTORY — PX: CARPAL TUNNEL RELEASE: SHX101

## 2014-09-09 HISTORY — DX: Personal history of (healed) traumatic fracture: Z87.81

## 2014-09-09 HISTORY — DX: Pure hypercholesterolemia, unspecified: E78.00

## 2014-09-09 HISTORY — DX: Other amnesia: R41.3

## 2014-09-09 LAB — POCT HEMOGLOBIN-HEMACUE: HEMOGLOBIN: 16.7 g/dL (ref 13.0–17.0)

## 2014-09-09 SURGERY — CARPAL TUNNEL RELEASE
Anesthesia: General | Site: Wrist | Laterality: Right

## 2014-09-09 MED ORDER — ONDANSETRON HCL 4 MG PO TABS: 4.0000 mg | ORAL_TABLET | Freq: Three times a day (TID) | ORAL | Status: DC | PRN

## 2014-09-09 MED ORDER — CEFAZOLIN SODIUM-DEXTROSE 2-3 GM-% IV SOLR
INTRAVENOUS | Status: AC
  Filled 2014-09-09: qty 50

## 2014-09-09 MED ORDER — CEFAZOLIN SODIUM-DEXTROSE 2-3 GM-% IV SOLR
INTRAVENOUS | Status: DC | PRN
  Administered 2014-09-09: 2 g via INTRAVENOUS

## 2014-09-09 MED ORDER — BUPIVACAINE HCL (PF) 0.5 % IJ SOLN
INTRAMUSCULAR | Status: DC | PRN
  Administered 2014-09-09: 10 mL

## 2014-09-09 MED ORDER — HYDROMORPHONE HCL 1 MG/ML IJ SOLN: 0.2500 mg | INTRAMUSCULAR | Status: DC | PRN

## 2014-09-09 MED ORDER — MIDAZOLAM HCL 5 MG/5ML IJ SOLN
INTRAMUSCULAR | Status: DC | PRN
  Administered 2014-09-09: 2 mg via INTRAVENOUS

## 2014-09-09 MED ORDER — LACTATED RINGERS IV SOLN
INTRAVENOUS | Status: DC
  Administered 2014-09-09: 08:00:00 via INTRAVENOUS

## 2014-09-09 MED ORDER — PROMETHAZINE HCL 25 MG/ML IJ SOLN: 6.2500 mg | INTRAMUSCULAR | Status: DC | PRN

## 2014-09-09 MED ORDER — MIDAZOLAM HCL 2 MG/ML PO SYRP: 12.0000 mg | ORAL_SOLUTION | Freq: Once | ORAL | Status: DC | PRN

## 2014-09-09 MED ORDER — DEXAMETHASONE SODIUM PHOSPHATE 10 MG/ML IJ SOLN
INTRAMUSCULAR | Status: DC | PRN
  Administered 2014-09-09: 10 mg via INTRAVENOUS

## 2014-09-09 MED ORDER — CEFAZOLIN SODIUM-DEXTROSE 2-3 GM-% IV SOLR: 2.0000 g | INTRAVENOUS | Status: DC

## 2014-09-09 MED ORDER — CHLORHEXIDINE GLUCONATE 4 % EX LIQD: 60.0000 mL | Freq: Once | CUTANEOUS | Status: DC

## 2014-09-09 MED ORDER — OXYCODONE-ACETAMINOPHEN 5-325 MG PO TABS: 1.0000 | ORAL_TABLET | ORAL | Status: DC | PRN

## 2014-09-09 MED ORDER — FENTANYL CITRATE (PF) 100 MCG/2ML IJ SOLN
INTRAMUSCULAR | Status: AC
  Filled 2014-09-09: qty 6

## 2014-09-09 MED ORDER — LIDOCAINE HCL (CARDIAC) 20 MG/ML IV SOLN
INTRAVENOUS | Status: DC | PRN
  Administered 2014-09-09: 50 mg via INTRAVENOUS

## 2014-09-09 MED ORDER — PROPOFOL 10 MG/ML IV BOLUS
INTRAVENOUS | Status: DC | PRN
  Administered 2014-09-09: 200 mg via INTRAVENOUS

## 2014-09-09 MED ORDER — MIDAZOLAM HCL 2 MG/2ML IJ SOLN
INTRAMUSCULAR | Status: AC
  Filled 2014-09-09: qty 2

## 2014-09-09 MED ORDER — FENTANYL CITRATE (PF) 100 MCG/2ML IJ SOLN
INTRAMUSCULAR | Status: DC | PRN
  Administered 2014-09-09: 100 ug via INTRAVENOUS

## 2014-09-09 MED ORDER — MIDAZOLAM HCL 2 MG/2ML IJ SOLN: 1.0000 mg | INTRAMUSCULAR | Status: DC | PRN

## 2014-09-09 SURGICAL SUPPLY — 43 items
BANDAGE ELASTIC 3 VELCRO ST LF (GAUZE/BANDAGES/DRESSINGS) ×2 IMPLANT
BLADE SURG 15 STRL LF DISP TIS (BLADE) ×1 IMPLANT
BLADE SURG 15 STRL SS (BLADE) ×2
BNDG CMPR 9X4 STRL LF SNTH (GAUZE/BANDAGES/DRESSINGS) ×1
BNDG COHESIVE 3X5 TAN STRL LF (GAUZE/BANDAGES/DRESSINGS) ×2 IMPLANT
BNDG ESMARK 4X9 LF (GAUZE/BANDAGES/DRESSINGS) ×1 IMPLANT
CORDS BIPOLAR (ELECTRODE) IMPLANT
COVER BACK TABLE 60X90IN (DRAPES) ×2 IMPLANT
COVER MAYO STAND STRL (DRAPES) ×2 IMPLANT
CUFF TOURNIQUET SINGLE 18IN (TOURNIQUET CUFF) ×1 IMPLANT
DRAPE EXTREMITY T 121X128X90 (DRAPE) ×2 IMPLANT
DRAPE SURG 17X23 STRL (DRAPES) ×2 IMPLANT
DURAPREP 26ML APPLICATOR (WOUND CARE) ×2 IMPLANT
GAUZE SPONGE 4X4 12PLY STRL (GAUZE/BANDAGES/DRESSINGS) ×2 IMPLANT
GAUZE XEROFORM 1X8 LF (GAUZE/BANDAGES/DRESSINGS) ×2 IMPLANT
GLOVE BIOGEL PI IND STRL 7.0 (GLOVE) ×1 IMPLANT
GLOVE BIOGEL PI IND STRL 7.5 (GLOVE) IMPLANT
GLOVE BIOGEL PI INDICATOR 7.0 (GLOVE) ×3
GLOVE BIOGEL PI INDICATOR 7.5 (GLOVE) ×1
GLOVE ECLIPSE 7.0 STRL STRAW (GLOVE) ×3 IMPLANT
GLOVE ORTHO TXT STRL SZ7.5 (GLOVE) ×1 IMPLANT
GLOVE SURG ORTHO 8.0 STRL STRW (GLOVE) ×2 IMPLANT
GLOVE SURG SS PI 7.0 STRL IVOR (GLOVE) ×1 IMPLANT
GOWN STRL REUS W/ TWL LRG LVL3 (GOWN DISPOSABLE) ×2 IMPLANT
GOWN STRL REUS W/ TWL XL LVL3 (GOWN DISPOSABLE) ×1 IMPLANT
GOWN STRL REUS W/TWL LRG LVL3 (GOWN DISPOSABLE) ×6
GOWN STRL REUS W/TWL XL LVL3 (GOWN DISPOSABLE) ×2
NDL HYPO 25X1 1.5 SAFETY (NEEDLE) ×1 IMPLANT
NEEDLE HYPO 25X1 1.5 SAFETY (NEEDLE) ×2 IMPLANT
NS IRRIG 1000ML POUR BTL (IV SOLUTION) ×2 IMPLANT
PACK BASIN DAY SURGERY FS (CUSTOM PROCEDURE TRAY) ×2 IMPLANT
PAD CAST 3X4 CTTN HI CHSV (CAST SUPPLIES) ×2 IMPLANT
PADDING CAST ABS 3INX4YD NS (CAST SUPPLIES) ×1
PADDING CAST ABS COTTON 3X4 (CAST SUPPLIES) ×1 IMPLANT
PADDING CAST COTTON 3X4 STRL (CAST SUPPLIES) ×4
SPLINT PLASTER CAST XFAST 3X15 (CAST SUPPLIES) ×10 IMPLANT
SPLINT PLASTER XTRA FASTSET 3X (CAST SUPPLIES) ×10
STOCKINETTE 4X48 STRL (DRAPES) ×2 IMPLANT
SUT ETHILON 3 0 PS 1 (SUTURE) ×2 IMPLANT
SYR BULB 3OZ (MISCELLANEOUS) ×2 IMPLANT
SYR CONTROL 10ML LL (SYRINGE) ×2 IMPLANT
TOWEL OR 17X24 6PK STRL BLUE (TOWEL DISPOSABLE) ×2 IMPLANT
UNDERPAD 30X30 INCONTINENT (UNDERPADS AND DIAPERS) ×1 IMPLANT

## 2014-09-09 NOTE — Anesthesia Postprocedure Evaluation (Signed)
Anesthesia Post Note  Patient: Calvin Johnston  Procedure(s) Performed: Procedure(s) (LRB): RIGHT WRIST CARPAL TUNNEL RELEASE (Right)  Anesthesia type: general  Patient location: PACU  Post pain: Pain level controlled  Post assessment: Patient's Cardiovascular Status Stable  Last Vitals:  Filed Vitals:   09/09/14 1030  BP: 129/77  Pulse: 54  Temp:   Resp: 13    Post vital signs: Reviewed and stable  Level of consciousness: sedated  Complications: No apparent anesthesia complications

## 2014-09-09 NOTE — Discharge Instructions (Signed)
Carpal Tunnel Release (Repair), Care After Refer to this sheet in the next few weeks. These discharge instructions provide you with general information on caring for yourself after you leave the hospital. Your caregiver may also give you specific instructions. Your treatment has been planned according to the most current medical practices available, but unavoidable complications sometimes occur. If you have any problems or questions after discharge, please call your caregiver. HOME CARE INSTRUCTIONS   Have a responsible person with you for 24 hours.  Do not drive a car or take public transportation for 24 hours.  Only take over-the-counter or prescription medicines for pain, discomfort, or fever as directed by your caregiver. Take them as directed.  You may put ice on the palm side of the affected wrist.  Put ice in a plastic bag.  Place a towel between your skin and the bag.  Leave the ice on for 15-20 minutes, 03-04 times per day.  Do not remove splint until seen in office.  You may shower but do not let splint get wet.  Keep your hand raised (elevated) above the level of your heart as much as possible. This keeps swelling down and helps with discomfort.  Change bandages (dressings) as directed.  Keep the wound clean and dry. SEEK MEDICAL CARE IF:   You develop pain not relieved with medicines.  You develop numbness of your hand.  You develop bleeding from your surgical site.  You have an oral temperature above 102 F (38.9 C).  You develop redness or swelling of the surgical site.  You develop new, unexplained problems. SEEK IMMEDIATE MEDICAL CARE IF:   You develop a rash.  You have difficulty breathing.  You develop any reaction or side effects to medicines given. MAKE SURE YOU:   Understand these instructions.  Will watch your condition.  Will get help right away if you are not doing well or get worse. Document Released: 11/24/2004 Document Revised:  02/25/2013 Document Reviewed: 03/13/2007 [facility] Patient Information 2015 [place], [place]. This information is not intended to replace advice given to you by your health care provider. Make sure you discuss any questions you have with your health care provider.    Post Anesthesia Home Care Instructions  Activity: Get plenty of rest for the remainder of the day. A responsible adult should stay with you for 24 hours following the procedure.  For the next 24 hours, DO NOT: -Drive a car -Advertising copywriterperate machinery -Drink alcoholic beverages -Take any medication unless instructed by your physician -Make any legal decisions or sign important papers.  Meals: Start with liquid foods such as gelatin or soup. Progress to regular foods as tolerated. Avoid greasy, spicy, heavy foods. If nausea and/or vomiting occur, drink only clear liquids until the nausea and/or vomiting subsides. Call your physician if vomiting continues.  Special Instructions/Symptoms: Your throat may feel dry or sore from the anesthesia or the breathing tube placed in your throat during surgery. If this causes discomfort, gargle with warm salt water. The discomfort should disappear within 24 hours.  If you had a scopolamine patch placed behind your ear for the management of post- operative nausea and/or vomiting:  1. The medication in the patch is effective for 72 hours, after which it should be removed.  Wrap patch in a tissue and discard in the trash. Wash hands thoroughly with soap and water. 2. You may remove the patch earlier than 72 hours if you experience unpleasant side effects which may include dry mouth, dizziness or visual  disturbances. 3. Avoid touching the patch. Wash your hands with soap and water after contact with the patch.

## 2014-09-09 NOTE — Interval H&P Note (Signed)
History and Physical Interval Note:  09/09/2014 7:29 AM  Calvin Johnston  has presented today for surgery, with the diagnosis of carpal tunnel syndrome right upper limb  G56.01  The various methods of treatment have been discussed with the patient and family. After consideration of risks, benefits and other options for treatment, the patient has consented to  Procedure(s): RIGHT WRIST CARPAL TUNNEL RELEASE (Right) as a surgical intervention .  The patient's history has been reviewed, patient examined, no change in status, stable for surgery.  I have reviewed the patient's chart and labs.  Questions were answered to the patient's satisfaction.     Jaleya Pebley F

## 2014-09-09 NOTE — Anesthesia Procedure Notes (Signed)
Procedure Name: LMA Insertion Date/Time: 09/09/2014 9:36 AM Performed by: Genevieve NorlanderLINKA, Calvin Johnston Pre-anesthesia Checklist: Patient identified, Emergency Drugs available, Suction available, Patient being monitored and Timeout performed Patient Re-evaluated:Patient Re-evaluated prior to inductionOxygen Delivery Method: Circle System Utilized Preoxygenation: Pre-oxygenation with 100% oxygen Intubation Type: IV induction Ventilation: Mask ventilation without difficulty LMA: LMA inserted LMA Size: 5.0 Number of attempts: 1 Airway Equipment and Method: Bite block Placement Confirmation: positive ETCO2 Tube secured with: Tape Dental Injury: Teeth and Oropharynx as per pre-operative assessment

## 2014-09-09 NOTE — Anesthesia Preprocedure Evaluation (Addendum)
Anesthesia Evaluation  Patient identified by MRN, date of birth, ID band Patient awake    Reviewed: Allergy & Precautions, H&P , NPO status , Patient's Chart, lab work & pertinent test results  History of Anesthesia Complications (+) PONV and history of anesthetic complications  Airway Mallampati: II  TM Distance: >3 FB Neck ROM: full    Dental  (+) Teeth Intact, Dental Advidsory Given   Pulmonary neg pulmonary ROS,    Pulmonary exam normal       Cardiovascular hypertension, On Medications negative cardio ROS      Neuro/Psych negative neurological ROS  negative psych ROS   GI/Hepatic negative GI ROS, Neg liver ROS,   Endo/Other  negative endocrine ROS  Renal/GU negative Renal ROS     Musculoskeletal   Abdominal Normal abdominal exam  (+)   Peds  Hematology   Anesthesia Other Findings   Reproductive/Obstetrics negative OB ROS                           Anesthesia Physical Anesthesia Plan  ASA: II  Anesthesia Plan: General LMA   Post-op Pain Management:    Induction:   Airway Management Planned:   Additional Equipment:   Intra-op Plan:   Post-operative Plan: Extubation in OR  Informed Consent: I have reviewed the patients History and Physical, chart, labs and discussed the procedure including the risks, benefits and alternatives for the proposed anesthesia with the patient or authorized representative who has indicated his/her understanding and acceptance.   Dental Advisory Given  Plan Discussed with: Anesthesiologist, CRNA and Surgeon  Anesthesia Plan Comments:       Anesthesia Quick Evaluation

## 2014-09-09 NOTE — Transfer of Care (Signed)
Immediate Anesthesia Transfer of Care Note  Patient: Calvin Johnston  Procedure(s) Performed: Procedure(s): RIGHT WRIST CARPAL TUNNEL RELEASE (Right)  Patient Location: PACU  Anesthesia Type:General  Level of Consciousness: sedated and patient cooperative  Airway & Oxygen Therapy: Patient Spontanous Breathing and Patient connected to face mask oxygen  Post-op Assessment: Report given to RN and Post -op Vital signs reviewed and stable  Post vital signs: Reviewed and stable  Last Vitals:  Filed Vitals:   09/09/14 0731  BP: 135/87  Pulse: 65  Temp: 36.6 C  Resp: 20    Complications: No apparent anesthesia complications

## 2014-09-10 ENCOUNTER — Encounter (HOSPITAL_BASED_OUTPATIENT_CLINIC_OR_DEPARTMENT_OTHER): Payer: Self-pay | Admitting: Orthopedic Surgery

## 2014-09-10 NOTE — Op Note (Signed)
NAME:  Calvin Johnston, [name]                 ACCOUNT NO.:  [id number]  MEDICAL RECORD NO.:  [id number]  LOCATION:                                FACILITY:  [facility]  PHYSICIAN:  Loreta Aveaniel F. Denae Zulueta, M.D. DATE OF BIRTH:  04/25/1976  DATE OF PROCEDURE:  09/09/2014 DATE OF DISCHARGE:  09/09/2014                              OPERATIVE REPORT   PREOPERATIVE DIAGNOSIS:  Right carpal tunnel syndrome.  POSTOPERATIVE DIAGNOSIS:  Right carpal tunnel syndrome.  PROCEDURE:  Right carpal tunnel release.  SURGEON:  Loreta Aveaniel F. Tyronza Happe, MD  ASSISTANT:  Mikey KirschnerLindsey Stanberry, PA  ANESTHESIA:  General.  BLOOD LOSS:  Minimal.  SPECIMENS:  None.  CULTURES:  None.  COMPLICATIONS:  None.  DRESSINGS:  Soft compressive bulky hand dressing was applied.  TOURNIQUET TIME:  Twenty five minutes.  PROCEDURE IN DETAIL:  The patient was brought to the operating room, placed on the operating table in supine position.  After adequate anesthesia was obtained, tourniquet applied, prepped and draped in usual sterile fashion. Exsanguinated with elevation of Esmarch.  Tourniquet inflated to 250 mmHg.  Small longitudinal incision over the carpal tunnel.  Retinaculum identified.  Incised under direct visualization from the forearm fascia proximally, the palmar arch distally.  Moderate erythema, some narrowing of the nerve not extreme.  Complete decompression. Digital branch, motor branch identified, protected, and decompressed. Wound irrigated, closed with nylon,  injected with Marcaine.  Sterile compressive dressing applied.  Bulky hand dressing was applied. Tourniquet deflated removed.  Anesthesia reversed.  Brought to the recovery room.  Tolerated the surgery well.  No complications.     Loreta Aveaniel F. Rasheka Denard, M.D.     DFM/MEDQ  D:  09/09/2014  T:  09/09/2014  Job:  [id number]

## 2015-01-19 ENCOUNTER — Telehealth: Payer: Self-pay | Admitting: Family Medicine

## 2015-01-19 NOTE — Telephone Encounter (Signed)
Family Medicine Emergency Line Telephone Note  Wife calls stating the her husband wanted her to call us.  Husband in [place] because his dad had stroke.  Told the doctors he "didnt feel right" and RN took BP and it was "really high".  Had already taken home BP meds.  RN told him to put enalapril  under his tongue.  BP came down, reportedly, but he is now "feeling weird".  Wife cannot quantify this more for me.  She thinks it may be related to lack of sleep or crying and stress.  Explained to wife that I could not do anything in this situation.  If patient is having any CP, SOB, allergic reaction symptoms, etc he should seek medical care.  Reassured that enalapril should not be harmful to patient.    Erasmo Downer, MD, MPH PGY-2,  [facility] 01/19/2015 10:41 PM

## 2015-03-10 ENCOUNTER — Other Ambulatory Visit: Payer: Self-pay | Admitting: Family Medicine

## 2015-03-10 MED ORDER — ATORVASTATIN CALCIUM 10 MG PO TABS: 10.0000 mg | ORAL_TABLET | Freq: Every day | ORAL | Status: DC

## 2015-03-10 MED ORDER — HYDROCHLOROTHIAZIDE 25 MG PO TABS: 25.0000 mg | ORAL_TABLET | Freq: Every day | ORAL | Status: DC

## 2015-03-28 ENCOUNTER — Other Ambulatory Visit: Payer: Self-pay | Admitting: *Deleted

## 2015-03-28 MED ORDER — ATORVASTATIN CALCIUM 10 MG PO TABS: 10.0000 mg | ORAL_TABLET | Freq: Every day | ORAL | Status: DC

## 2015-03-29 MED ORDER — ATORVASTATIN CALCIUM 10 MG PO TABS: 10.0000 mg | ORAL_TABLET | Freq: Every day | ORAL | Status: DC

## 2015-03-29 NOTE — Addendum Note (Signed)
Addended by: Clovis PuMARTIN, Tynetta Bachmann L on: 03/29/2015 10:04 AM   Modules accepted: Orders

## 2015-03-29 NOTE — Telephone Encounter (Signed)
Refill request is for a 90 day supply of atorvastatin.  Only a 30 day supply sent in.  Please advise.  Clovis PuMartin, Tamika L, RN

## 2015-07-13 ENCOUNTER — Other Ambulatory Visit: Payer: Self-pay | Admitting: *Deleted

## 2015-07-14 MED ORDER — ATORVASTATIN CALCIUM 10 MG PO TABS: 10.0000 mg | ORAL_TABLET | Freq: Every day | ORAL | Status: DC

## 2015-10-21 ENCOUNTER — Telehealth: Payer: Self-pay | Admitting: Family Medicine

## 2015-10-21 NOTE — Telephone Encounter (Signed)
Wife is calling because her husband has been feeling a little weird lately. He is not sure what the problem is but he doesn't like how he is feeling. He has made an appointment for 11/17/15. She is hoping that the doctor could squeeze her husband in some time next week. She really hates to see him wait until then. He doesn't want to see anyone else. jw

## 2015-10-21 NOTE — Telephone Encounter (Signed)
Will forward to MD to advise. Georgana Romain,CMA  

## 2015-10-24 NOTE — Telephone Encounter (Signed)
Tried calling both numbers listed for patient and his wife with no answer or VM available. Jazmin Hartsell,CMA

## 2015-10-24 NOTE — Telephone Encounter (Signed)
Could we schedule Mr Calvin Johnston to see me this Thursday at 8:30 either with me or with SDA then have me paged?

## 2015-10-25 NOTE — Telephone Encounter (Signed)
Patient has an appt on 10-27-15 at 9am. [facility], TheJazmin Hartsell,CMA

## 2015-10-25 NOTE — Telephone Encounter (Signed)
LM for wife/patient to call back and schedule an appt.  If he can come in on Thursday please schedule him according to Dr. McDiarmid's request. Burnard HawthorneJazmin Hartsell,CMA

## 2015-10-27 ENCOUNTER — Ambulatory Visit (INDEPENDENT_AMBULATORY_CARE_PROVIDER_SITE_OTHER): Payer: BLUE CROSS/BLUE SHIELD | Admitting: Family Medicine

## 2015-10-27 ENCOUNTER — Encounter: Payer: Self-pay | Admitting: Family Medicine

## 2015-10-27 VITALS — BP 138/96 | HR 65 | Temp 98.0°F | Wt 206.0 lb

## 2015-10-27 DIAGNOSIS — R202 Paresthesia of skin: Secondary | ICD-10-CM | POA: Diagnosis not present

## 2015-10-27 DIAGNOSIS — K760 Fatty (change of) liver, not elsewhere classified: Secondary | ICD-10-CM | POA: Diagnosis not present

## 2015-10-27 DIAGNOSIS — E785 Hyperlipidemia, unspecified: Secondary | ICD-10-CM

## 2015-10-27 DIAGNOSIS — I10 Essential (primary) hypertension: Secondary | ICD-10-CM

## 2015-10-27 DIAGNOSIS — M79601 Pain in right arm: Secondary | ICD-10-CM | POA: Insufficient documentation

## 2015-10-27 DIAGNOSIS — M79602 Pain in left arm: Secondary | ICD-10-CM

## 2015-10-27 DIAGNOSIS — R7303 Prediabetes: Secondary | ICD-10-CM

## 2015-10-27 HISTORY — DX: Paresthesia of skin: R20.2

## 2015-10-27 LAB — CBC WITH DIFFERENTIAL/PLATELET
BASOS ABS: 50 {cells}/uL (ref 0–200)
Basophils Relative: 1 %
EOS ABS: 150 {cells}/uL (ref 15–500)
Eosinophils Relative: 3 %
HCT: 48.5 % (ref 38.5–50.0)
Hemoglobin: 16.4 g/dL (ref 13.2–17.1)
LYMPHS PCT: 40 %
Lymphs Abs: 2000 cells/uL (ref 850–3900)
MCH: 30 pg (ref 27.0–33.0)
MCHC: 33.8 g/dL (ref 32.0–36.0)
MCV: 88.8 fL (ref 80.0–100.0)
MPV: 10.6 fL (ref 7.5–12.5)
Monocytes Absolute: 450 cells/uL (ref 200–950)
Monocytes Relative: 9 %
NEUTROS ABS: 2350 {cells}/uL (ref 1500–7800)
Neutrophils Relative %: 47 %
Platelets: 246 10*3/uL (ref 140–400)
RBC: 5.46 MIL/uL (ref 4.20–5.80)
RDW: 13.9 % (ref 11.0–15.0)
WBC: 5 10*3/uL (ref 3.8–10.8)

## 2015-10-27 LAB — COMPLETE METABOLIC PANEL WITH GFR
ALBUMIN: 4.5 g/dL (ref 3.6–5.1)
ALT: 77 U/L — ABNORMAL HIGH (ref 9–46)
AST: 38 U/L (ref 10–40)
Alkaline Phosphatase: 56 U/L (ref 40–115)
BUN: 19 mg/dL (ref 7–25)
CHLORIDE: 102 mmol/L (ref 98–110)
CO2: 24 mmol/L (ref 20–31)
Calcium: 9.2 mg/dL (ref 8.6–10.3)
Creat: 0.94 mg/dL (ref 0.60–1.35)
GFR, Est African American: >89 mL/min (ref >=60)
GFR, Est Non African American: >89 mL/min (ref >=60)
Glucose, Bld: 102 mg/dL — ABNORMAL HIGH (ref 65–99)
POTASSIUM: 3.9 mmol/L (ref 3.5–5.3)
SODIUM: 140 mmol/L (ref 135–146)
Total Bilirubin: 0.7 mg/dL (ref 0.2–1.2)
Total Protein: 7.2 g/dL (ref 6.1–8.1)

## 2015-10-27 LAB — LIPID PANEL
CHOL/HDL RATIO: 3.8 ratio (ref <=5.0)
Cholesterol: 175 mg/dL (ref 125–200)
HDL: 46 mg/dL (ref >=40)
LDL Cholesterol: 103 mg/dL (ref <130)
Triglycerides: 129 mg/dL (ref <150)
VLDL: 26 mg/dL (ref <30)

## 2015-10-27 LAB — FERRITIN: FERRITIN: 163 ng/mL (ref 20–345)

## 2015-10-27 LAB — IRON AND TIBC
%SAT: 42 % (ref 15–60)
IRON: 126 ug/dL (ref 50–180)
TIBC: 301 ug/dL (ref 250–425)
UIBC: 175 ug/dL (ref 125–400)

## 2015-10-27 LAB — POCT SEDIMENTATION RATE: POCT SED RATE: 2 mm/hr (ref 0–22)

## 2015-10-27 LAB — POCT GLYCOSYLATED HEMOGLOBIN (HGB A1C): Hemoglobin A1C: 5.8

## 2015-10-27 MED ORDER — PNEUMOCOCCAL VAC POLYVALENT 25 MCG/0.5ML IJ INJ
0.5000 mL | INJECTION | INTRAMUSCULAR | Status: AC
  Administered 2015-10-27: 0.5 mL via INTRAMUSCULAR

## 2015-10-27 NOTE — Assessment & Plan Note (Signed)
Adequate blood pressure control.  No evidence of new end organ damage.  Tolerating medication without significant adverse effects.  Plan to continue current blood pressure regiment.   

## 2015-10-27 NOTE — Assessment & Plan Note (Addendum)
Established problem. Stable.  checking CMET, other causes of liver disease with ANA, Iron studies and Ferritin.Marland Kitchen. NAFLD fibrosis score = 1.515 c/w possible significant fibrosis HBV vaccinations completed already.  HCV, HAV and HBC serologies negative in 2015. If change in NAFLD score or abnormal ferritin may order liver US w/ elastography Lab Results  Component Value Date   ALT 77* 10/27/2015  AST WNL  Iron/TIBC/Ferritin/ %Sat    Component Value Date/Time   IRON 126 10/27/2015 0954   TIBC 301 10/27/2015 0954   FERRITIN 163 10/27/2015 0954   IRONPCTSAT 42 10/27/2015 0954    Pneumovax given today for NAFLD.  RTC in a month.  Will recommend RUQ US w/ elastography

## 2015-10-27 NOTE — Patient Instructions (Signed)
I will call you with your lab results.  I will see you back on 6/29 to discuss whether or not we need to get a MRI of your spine in the neck.

## 2015-10-27 NOTE — Assessment & Plan Note (Addendum)
New problem Further workup planned. Labs drawn today  SPEP & serum IFE WNL: no monoclonal spikes.  ANA negative.  CMET WNL except ALT slightly above normal.  A1c = 5.8% . Pt already had TSH 2015 Had HIV and HCV in 2015   Needs Vit B12 and B1 next visit.   If labs unrevealing would recommend cervical and brain MRI to look for central lesions of CNS

## 2015-10-27 NOTE — Progress Notes (Signed)
Subjective:    Patient ID: Calvin Johnston, male    DOB: 01-14-1976, 40 y.o.   MRN: [id number] Calvin Johnston is alone Sources of clinical information for visit is/are patient and past medical records. Nursing assessment for this office visit was reviewed with the patient for accuracy and revision.   HPI  Problem List Items Addressed This Visit      Unprioritized   Prediabetes - Primary - Onset several years ago - Gaining weight - No change in vision, No polyuria, no chest pain -    Relevant Orders   Lipid panel   POCT glycosylated hemoglobin (Hb A1C) (Completed)   Paresthesias HPI Location: bilateral legs & feet in stocking distribution to knees.  Bilateral arms circumferentially and entire hands  With extending up to mid forearm on right and up to shoulder on left Duration: one month Alleviating factors: working Aggravating Factors: rest, slightly worse at bedtime.  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  Associated symptoms: No joint swelling or redness. No fever.  Denies herbals/supplements/ vitamins other than daily multivit OTC, no rashes, no weakness in hands/arms/legs/feet. No weight loss. No cough, no wheezing, no shortness of breath. No urinary incontinence nor diffculty voiding. No change in  Vision, no loss of balance.  Quality: Pins and Needles.  Not painful Pattern: continuous when not working Course: stable Context: Sad about recent death of father.  Denies Panic attack symptoms. (+) Prediabetes Prior head and Neck injury with C2 fracture around 2007 with postconcussive syndrome  Prior office visit 04/02/14 with complaint of numbness in right hand fingers "Patient states that he has had a longstanding (>1 year) h/o numbness in his right middle, ring and pinky fingers. ... He reports that tingling is not present at rest but becomes worse with hammering. He  works as a Actor. He denies weakness, sensation changes anywhere else, or swelling" Dx with Carpel Tunnel Syndrom and given wrist brace.  Sports med consult 04/26/16 for right hand numbness with whole hand numb Left elbow surgery for ulnar nerve repair 2008 Right wrist carpel tunnel release 08/2014 Prior negative HIV ab 2015, HCV ab neg 2015, HBV serologies neg in 2015 Prior HBV vaccination series x 3. FH: no FH of neuropathy SH: No alcohol    Nonalcoholic fatty liver disease - Diagnosis 2015  - Received HBV vacc series - has not lost weight.  Not eating healthily.  - denies RUQ pain    Denies jaundice.  Denies clay colored stools.    Relevant Medications   pneumococcal 23 valent vaccine (PNU-IMMUNE) injection 0.5 mL (Completed) (Start on 10/28/2015 10:00 AM)   Other Relevant Orders   Ferritin   Hepatitis C Antibody   Hypertension - Longstanding since early 2000s - No chest pain, no claudication, no focal limb or facial weakness, no diffculty speaking - Taking HCTZ, No syncope  No edema   Hyperlipidemia - longstanding - taking atorvastatin - no symptoms CV disease    Relevant Orders   Lipid panel    Other Visit Diagnoses    Paresthesia and pain of both upper extremities        Paresthesia of left upper and lower extremity        Relevant Orders    COMPLETE METABOLIC PANEL WITH GFR    Iron and TIBC    CBC with Differential    ANA    POCT SEDIMENTATION RATE (Completed)  Immunofixation electrophoresis    Protein electrophoresis, serum      SH: Father died from CVA.  No smoking   Review of Systems See HPI    Objective:   Physical Exam VS reviewed GEN: Alert, Cooperative, Groomed, NAD HEENT: PERRL; EAC bilaterally not occluded, TM's translucent with normal LM, (+) LR;                No cervical LAN, No thyromegaly, No palpable masses COR: RRR, No M/G/R, No JVD, Normal PMI size and location LUNGS: BCTA, No Acc mm use, speaking in full sentences ABDOMEN: (+)BS, soft,  NT, ND, No HSM, No palpable masses EXT: No peripheral leg edema. Feet without deformity or lesions. Palpable bilateral pedal pulses.  SKIN: No lesion nor rashes of face/trunk/extremities Neuro: Oriented to person, place, and time; Strength: 5/5 Bil. UE and LE symmetric; Sensation: Intact grossly to touch all four extremities in L4, L5 and S1 distributions; Cerebellar: Finger-to-Nose intact, Rhomberg negative DTR: Bilateral Bicep 2+, Brachioradialis 1+ left and 0 on right,  Bilateral Knees 2+, Bilateral Ankles 1+ Gait: Normal speed, No significant path deviation, Step through +,  Psych: Normal affect/thought/speech/language    Assessment & Plan:

## 2015-10-27 NOTE — Assessment & Plan Note (Signed)
Lipid panel drawn. Tolerating medications.  No new end-organ damage.  Continue current medications.

## 2015-10-28 LAB — HEPATITIS C ANTIBODY: HCV Ab: NEGATIVE

## 2015-10-28 LAB — ANA: Anti Nuclear Antibody(ANA): NEGATIVE

## 2015-10-31 LAB — PROTEIN ELECTROPHORESIS, SERUM
ALBUMIN ELP: 4.3 g/dL (ref 3.8–4.8)
Alpha-1-Globulin: 0.3 g/dL (ref 0.2–0.3)
Alpha-2-Globulin: 0.7 g/dL (ref 0.5–0.9)
BETA 2: 0.4 g/dL (ref 0.2–0.5)
Beta Globulin: 0.5 g/dL (ref 0.4–0.6)
GAMMA GLOBULIN: 1.1 g/dL (ref 0.8–1.7)
TOTAL PROTEIN, SERUM ELECTROPHOR: 7.2 g/dL (ref 6.1–8.1)

## 2015-10-31 LAB — IMMUNOFIXATION ELECTROPHORESIS
IGG (IMMUNOGLOBIN G), SERUM: 1165 mg/dL (ref 694–1618)
IGM, SERUM: 93 mg/dL (ref 48–271)
IgA: 218 mg/dL (ref 81–463)

## 2015-11-07 ENCOUNTER — Encounter: Payer: Self-pay | Admitting: Family Medicine

## 2015-11-17 ENCOUNTER — Encounter: Payer: Self-pay | Admitting: Family Medicine

## 2015-11-17 ENCOUNTER — Ambulatory Visit (INDEPENDENT_AMBULATORY_CARE_PROVIDER_SITE_OTHER): Payer: BLUE CROSS/BLUE SHIELD | Admitting: Family Medicine

## 2015-11-17 VITALS — BP 140/94 | HR 68 | Temp 97.6°F | Wt 201.0 lb

## 2015-11-17 DIAGNOSIS — K760 Fatty (change of) liver, not elsewhere classified: Secondary | ICD-10-CM | POA: Diagnosis not present

## 2015-11-17 DIAGNOSIS — R202 Paresthesia of skin: Secondary | ICD-10-CM

## 2015-11-17 DIAGNOSIS — I10 Essential (primary) hypertension: Secondary | ICD-10-CM | POA: Diagnosis not present

## 2015-11-17 DIAGNOSIS — E785 Hyperlipidemia, unspecified: Secondary | ICD-10-CM

## 2015-11-17 DIAGNOSIS — R7303 Prediabetes: Secondary | ICD-10-CM | POA: Diagnosis not present

## 2015-11-17 DIAGNOSIS — E669 Obesity, unspecified: Secondary | ICD-10-CM

## 2015-11-17 MED ORDER — AMLODIPINE BESYLATE 5 MG PO TABS: 5.0000 mg | ORAL_TABLET | Freq: Every day | ORAL | Status: DC

## 2015-11-17 NOTE — Patient Instructions (Signed)
Start Amlodipine for your blood pressure.  Continue the HCTZ Stop the Atorvastatin  Come into our lab in 4 to 6 weeks to have your cholesterol checked on the Red Yeast Rice  Work on getting your weight down to about 190 pounds.   We will call you for an Ultrasound of your liver to look for scarring from the fatty liver.

## 2015-11-18 ENCOUNTER — Encounter: Payer: Self-pay | Admitting: Family Medicine

## 2015-11-18 NOTE — Assessment & Plan Note (Signed)
Established problem. Stable. Pt identifies 190 lbs as a good weight for him. Discussed role of weight loss in both preventing or slowing progression of his prediabetes to diabetes, as well as decreasing risk of NAFLD progressing to cirrhosis. Scheduling for RUQ ultrasound with elastography fiving NAFLD score of 1.51. C/w possible significant fibrosis. Pt needs the US scheduled for Fridays as this is when he periodically returns to [place].

## 2015-11-18 NOTE — Assessment & Plan Note (Addendum)
Lab Results  Component Value Date   HGBA1C 5.8 10/27/2015   Established problem. Stable. Discussed role of weight loss in both preventing or slowing progression of his prediabetes to diabetes, as well as decreasing risk of NAFLD progressing to cirrhosis. Scheduling for RUQ ultrasound with elastography fiving NAFLD score of 1.51. C/w possible significant fibrosis. Pt needs the US scheduled for Fridays as this is when he periodically returns to home from job [place] of state.S ABDOMEN COMPLETE W/ELASTOGRAPHY

## 2015-11-18 NOTE — Assessment & Plan Note (Signed)
     Component Value Date/Time   CHOL 175 10/27/2015 0954   TRIG 129 10/27/2015 0954   HDL 46 10/27/2015 0954   VLDL 26 10/27/2015 0954   CHOLHDL 3.8 10/27/2015 0954   Lab Results  Component Value Date   LDLCALC 103 10/27/2015    Patient stopped atorvastatin after last office visit with resolution ot the peripheral neuropathy.  Pt trying Red Yeast Rice 1.2 g daily for 3 weeks folowed by 2.4 g daily since last week. [facility] to 6 weeks for check of LDL-c on Red Yeast Rice.

## 2015-11-18 NOTE — Assessment & Plan Note (Signed)
Established problem Uncontrollled Need reduction in diastolic pressure Will add back Amlodipine 5 mg daily.   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  Perhaps edema will not be as problematic with coadmin of HCTZ.  RTC 4-6 weeks for recheck BP and tolerance.

## 2015-11-18 NOTE — Assessment & Plan Note (Signed)
Established problem that has improved.  5 pounds down from last ov Wife cooking for him now with less fat and simple Carbohydrates. Pt identifies 190 lbs as a good weight for him. Discussed role of weight loss in both preventing or slowing progression of his prediabetes to diabetes, as well as decreasing risk of NAFLD progressing to cirrhosis.

## 2015-11-18 NOTE — Progress Notes (Signed)
   Subjective:    Patient ID: Calvin Johnston, male    DOB: 04-11-1976, 40 y.o.   MRN: [id number]  HPI  Problem List Items Addressed This Visit      Unprioritized   Prediabetes - Weight down 5 pounds  - Wife cooking meals for pt to take to work - [place] - FH: parents with DMT2 - SH: No smoking.    Paresthesias - Primary - Resolved after stopping atorvastatin right after last office visit - has been taking Red Yeast Rice, 0.6 gram per capsule, taking 2 capsule daily for 3 weeks and one capsule daily for most recent week.  - Able to work without difficulty    Nonalcoholic fatty liver disease - Present for over year - No RUQ pain, no jaundic - No alcohol   Hypertension Disease Monitoring  Blood pressure range: not checking at home  Chest pain: no   Dyspnea: no   Claudication: no   Medication compliance: yes HCTZ 25 mg daily Medication Side Effects  Lightheadedness: no   Urinary frequency: no   Edema: no    Preventitive Healthcare:  Exercise: no   Diet Pattern: has reduced fat and CH2O in diet since returning to [place] from [place]  Salt Restriction: no shaken salt          Hyperlipidemia - HYPERLIPIDEMIA  Disease Monitoring: See symptoms for Hypertension  Medications: Stopped Lipitor for paresthesias and strarted Red Yeast Rice RUQ pain- no  Muscle aches- no    SH: no smoking   Review of Systems  No muscle weakness No headache No nausea/vomiting Good appetite     Objective:   Physical Exam VS reviewed GEN: Alert, Cooperative, Groomed, NAD COR: RRR, No M/G/R, No JVD, Normal PMI size and location LUNGS: BCTA, No Acc mm use, speaking in full sentences ABDOMEN: (+)BS, soft, NT, ND, No HSM, No palpable masses SKIN: No jaundice Gait: Normal speed, No significant path deviation, Step through +,  Psych: Normal affect/thought/speech/language    Assessment & Plan:

## 2015-11-18 NOTE — Assessment & Plan Note (Signed)
Established problem that has improved.  Resolved with discontinuation of atorvastatin per pt.  Monitor for recurrence.  Lab work up was unrevealing.

## 2015-11-29 ENCOUNTER — Other Ambulatory Visit: Payer: Self-pay | Admitting: Family Medicine

## 2015-11-29 MED ORDER — AZITHROMYCIN 250 MG PO TABS
ORAL_TABLET | ORAL | Status: DC
Start: 2015-11-29 — End: 2016-01-13

## 2015-11-29 NOTE — Progress Notes (Signed)
His wife is positive for chlamydia and she requested I call in treatment for him as well.

## 2016-01-11 ENCOUNTER — Ambulatory Visit (INDEPENDENT_AMBULATORY_CARE_PROVIDER_SITE_OTHER): Payer: BLUE CROSS/BLUE SHIELD | Admitting: Family Medicine

## 2016-01-11 ENCOUNTER — Encounter: Payer: Self-pay | Admitting: Family Medicine

## 2016-01-11 VITALS — BP 144/94 | HR 97 | Temp 98.3°F | Wt 199.0 lb

## 2016-01-11 DIAGNOSIS — E785 Hyperlipidemia, unspecified: Secondary | ICD-10-CM | POA: Diagnosis not present

## 2016-01-11 DIAGNOSIS — R21 Rash and other nonspecific skin eruption: Secondary | ICD-10-CM

## 2016-01-11 LAB — LDL CHOLESTEROL, DIRECT: Direct LDL: 172 mg/dL — ABNORMAL HIGH (ref <130)

## 2016-01-11 LAB — POCT SKIN KOH: SKIN KOH, POC: NEGATIVE

## 2016-01-11 MED ORDER — TRIAMCINOLONE ACETONIDE 0.05 % EX OINT: TOPICAL_OINTMENT | CUTANEOUS | 0 refills | Status: DC

## 2016-01-11 NOTE — Progress Notes (Signed)
    Subjective: ZO:XWRUCC:rash HPI: Patient is a 40 y.o. male presenting to clinic today for a same day appt for rash on his left hadn.  Patient started having a rash at the base of his metacarpal bones (heel of hand) approximately 1 month ago. He noted it started like a callous which he's used to getting, but then worsened.  He used Gold Bond dry skin lotion. He tried anti-fungal cream for 1-2 weeks. He wwent to the beach last week and they noted it worsening.  The area is now cracked, peeling, and intermittently painful. He denies any pruritus. He denies any drainage or warmth of the area. No fevers, chills, or joint pain.  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  Past Medical History Patient Active Problem List   Diagnosis Date Noted  . Paresthesias 10/27/2015  . Prediabetes 05/07/2014  . Hyperlipidemia 10/09/2013  . Nonalcoholic fatty liver disease 10/09/2013  . Obesity 10/28/2006  . Hypertension     Medications- reviewed and updated Current Outpatient Prescriptions  Medication Sig Dispense Refill  . amLODipine (NORVASC) 5 MG tablet Take 1 tablet (5 mg total) by mouth daily. 90 tablet 0  . atorvastatin (LIPITOR) 10 MG tablet Take 1 tablet (10 mg total) by mouth daily. 30 tablet 5  . azithromycin (ZITHROMAX) 250 MG tablet Take all four tablets by mouth at one time. 4 tablet 0  . hydrochlorothiazide (HYDRODIURIL) 25 MG tablet Take 1 tablet (25 mg total) by mouth daily. 90 tablet 3  . Multiple Vitamin (MULTIVITAMIN) tablet Take 1 tablet by mouth daily.    . Omega-3 Fatty Acids (FISH OIL PO) Take by mouth.     No current facility-administered medications for this visit.     Objective: Office vital signs reviewed. BP (!) 144/94   Pulse 97   Temp 98.3 F (36.8 C) (Oral)   Wt 199 lb (90.3 kg)   SpO2 95%   BMI 33.12 kg/m    Physical Examination:  General: Awake, alert, well  nourished, NAD Skin: scaling, cracked rash at the base of his metacarpals extending up into the fingers and int he palms. No erythema, warmth, or drainage. No papules or vesicles.   Skin scrapping KOH negative  Assessment/Plan: Rash: most consistent with calloused, cracked skin which fits with his current employment. Given its worsening, I'm curious if there is some sort of contact dermatitis component as well. - continue Gold Bond dry skin - triamcinolone cream BID - if worsening or new symptoms, the patient was advised to follow up PRN, he will f/u with PCP in 2 weeks if no improvement with current treatment.  Joanna Puffrystal S. Telissa Palmisano PGY-3, [facility]

## 2016-01-11 NOTE — Patient Instructions (Signed)
Continue with the Gold Bond dry skin Use the triamcinolone ointment twice daily Follow up with us if it does not get better.

## 2016-01-13 ENCOUNTER — Telehealth: Payer: Self-pay | Admitting: Family Medicine

## 2016-01-13 ENCOUNTER — Encounter: Payer: Self-pay | Admitting: Family Medicine

## 2016-01-13 NOTE — Telephone Encounter (Signed)
Answer. Sent letter about LDL cholesterol results

## 2016-01-13 NOTE — Progress Notes (Signed)
Dear Calvin Johnston,  Your recent measurement of your LDL Cholesterol (01/11/16) was 172 mg/dL.  This is up quite a lot from the LDL Cholesterol (10/27/15) of 103 mg/dL when you were taking the atorvastatin.    You are not near a goal of LDL cholesterol less than 829130 mg/dL despite taking the Red Yeast Rice.  You could consider taking the atorvastatin just twice a week to see if you can tolerate it at this much less frequent dosing.  Many people who cannot tolerate daily dosing of atorvastatin can tolerate it at this less frequent dosing.   Another treatment option for you is to try a non-statin prescription medication to lower LDL cholesterol to replace your Red Yeast Rice called Tricor.  If you are interested in trying this medication or want to try the twice weekly dosing of atorvastatin, please call the [facility] to let Dr Maeve Debord know what you would like to do

## 2016-01-13 NOTE — Telephone Encounter (Signed)
Will forward to MD to address. Willard Farquharson,CMA  

## 2016-01-13 NOTE — Telephone Encounter (Signed)
Pt is calling to let Dr. McDiarmid know that he would like to start taking the new medication he has suggested for his cholesterol. jw

## 2016-01-16 MED ORDER — EZETIMIBE 10 MG PO TABS: 10.0000 mg | ORAL_TABLET | Freq: Every day | ORAL | 99 refills | Status: DC

## 2016-01-16 NOTE — Telephone Encounter (Signed)
Zetia called into [facility].  New medication for patient's hypercholesterolemia in setting of statin intolerance (myalgias)

## 2016-02-09 ENCOUNTER — Other Ambulatory Visit: Payer: Self-pay | Admitting: Family Medicine

## 2016-05-07 ENCOUNTER — Other Ambulatory Visit: Payer: Self-pay | Admitting: Family Medicine

## 2016-06-17 ENCOUNTER — Other Ambulatory Visit: Payer: Self-pay | Admitting: Family Medicine

## 2016-08-08 ENCOUNTER — Telehealth: Payer: Self-pay | Admitting: Family Medicine

## 2016-08-08 NOTE — Telephone Encounter (Signed)
Pt went to urgent medical due to side pain and nosebleeds. EKG was done and it was borderline. Pt was told he needed to get with dr Perley Jainmcdiarmid so he could order a stress test for pt. Please advise

## 2016-08-08 NOTE — Telephone Encounter (Signed)
Will forward to MD. Jazmin Hartsell,CMA  

## 2016-08-08 NOTE — Telephone Encounter (Signed)
Please schedule Mr Calvin Johnston to see me at my next clinic, either my continuity clinic at either 8:30 am appointment slot or may double book at other appointment slots.  I prefer using the 8:30 slot.   Please ask Mr Calvin Johnston to bring a copy of the EKG and notes from his visit to Urgent Care.  I cannot find these notes and EKG in EPIC.

## 2016-08-08 NOTE — Telephone Encounter (Signed)
Spoke with wife and she is aware of this and patient scheduled his appointment for 08-16-16 at 8:30am.  [facility] Bhakti Labella,CMA

## 2016-08-16 ENCOUNTER — Ambulatory Visit (INDEPENDENT_AMBULATORY_CARE_PROVIDER_SITE_OTHER): Payer: BLUE CROSS/BLUE SHIELD | Admitting: Family Medicine

## 2016-08-16 ENCOUNTER — Encounter: Payer: Self-pay | Admitting: Family Medicine

## 2016-08-16 VITALS — BP 124/82 | HR 72 | Temp 97.7°F | Ht 65.0 in | Wt 206.0 lb

## 2016-08-16 DIAGNOSIS — I10 Essential (primary) hypertension: Secondary | ICD-10-CM | POA: Diagnosis not present

## 2016-08-16 DIAGNOSIS — E7800 Pure hypercholesterolemia, unspecified: Secondary | ICD-10-CM

## 2016-08-16 DIAGNOSIS — Z6830 Body mass index (BMI) 30.0-30.9, adult: Secondary | ICD-10-CM

## 2016-08-16 DIAGNOSIS — E669 Obesity, unspecified: Secondary | ICD-10-CM | POA: Diagnosis not present

## 2016-08-16 DIAGNOSIS — R7303 Prediabetes: Secondary | ICD-10-CM | POA: Diagnosis not present

## 2016-08-16 DIAGNOSIS — K760 Fatty (change of) liver, not elsewhere classified: Secondary | ICD-10-CM | POA: Diagnosis not present

## 2016-08-16 DIAGNOSIS — R040 Epistaxis: Secondary | ICD-10-CM

## 2016-08-16 DIAGNOSIS — R7309 Other abnormal glucose: Secondary | ICD-10-CM | POA: Diagnosis not present

## 2016-08-16 DIAGNOSIS — R9431 Abnormal electrocardiogram [ECG] [EKG]: Secondary | ICD-10-CM | POA: Insufficient documentation

## 2016-08-16 HISTORY — DX: Other abnormal glucose: R73.09

## 2016-08-16 HISTORY — DX: Epistaxis: R04.0

## 2016-08-16 LAB — POCT GLYCOSYLATED HEMOGLOBIN (HGB A1C): Hemoglobin A1C: 5.8

## 2016-08-16 MED ORDER — FLUTICASONE PROPIONATE 50 MCG/ACT NA SUSP: 2.0000 | Freq: Every day | NASAL | 6 refills | Status: AC

## 2016-08-16 NOTE — Assessment & Plan Note (Signed)
New problem Possible incomplete RBBB No change from EKG 2016. Doubt significant for pathology other than possible age related degeneration with calcification in condction system. No further work up recommended at this time Continue CV RF reduction Monitor for CAD symptom development.

## 2016-08-16 NOTE — Assessment & Plan Note (Signed)
Adequate blood pressure control.  No evidence of new end organ damage.  Tolerating medication without significant adverse effects.  Plan to continue current blood pressure regiment.   
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
## 2016-08-16 NOTE — Assessment & Plan Note (Signed)
Lab Results  Component Value Date   HGBA1C 5.8 08/16/2016   Remains in at-risk for development of diabetes. Recommend reduction in wiehgt, Reduction in high fructose foods and drinks.

## 2016-08-16 NOTE — Assessment & Plan Note (Signed)
Established problem Asymptomatic Reschedule of RUQ US with elastorgraphy given NAFLD score of 1.51 c/w significant fibrosis risk.   Checking for persistence of ALT elevation.  Prior workup for other causes of ALT elevation have been unremarkale except Fatty livier on imaging.

## 2016-08-16 NOTE — Patient Instructions (Signed)
Nose Bleed - Start Flonase nasal spray to decrease the inflammation contributing to the nose bleed - Start Normal Saline Nasal Spray, one to two sprays every one to two hours while awake to keep lining of nose moist.   This spray in over the counter. - If nose bleeding continues after one to two weeks of this treatment, contact your family ENT doctor for an evaluation.  Left side pain - I believe this is pain from a rib or from the location where the bony part of the rib connects to the cartilage part of the rib. - Use the braqce from work as much as possible when you are awake, especially at work and at home if the side starts to hurt -   Right testicular pain - This may be a form of epididymitis.  If it returns and stays, let Dr McDairmid know.  - Use ice and a jock strap if the pain returns.    Epididymitis Epididymitis is swelling (inflammation) of the epididymis. The epididymis is a cord-like structure that is located along the top and back part of the testicle. It collects and stores sperm from the testicle. This condition can also cause pain and swelling of the testicle and scrotum. Symptoms usually start suddenly (acute epididymitis). Sometimes epididymitis starts gradually and lasts for a while (chronic epididymitis). This type may be harder to treat. What are the causes? In men 3035 and younger, this condition is usually caused by a bacterial infection or sexually transmitted disease (STD), such as:  Gonorrhea.  Chlamydia. In men 5935 and older who do not have anal sex, this condition is usually caused by bacteria from a blockage or abnormalities in the urinary system. These can result from:  Having a tube placed into the bladder (urinary catheter).  Having an enlarged or inflamed prostate gland.  Having recent urinary tract surgery. In men who have a condition that weakens the body's defense system (immune system), such as HIV, this condition can be caused by:  Other  bacteria, including tuberculosis and syphilis.  Viruses.  Fungi. Sometimes this condition occurs without infection. That may happen if urine flows backward into the epididymis after heavy lifting or straining. What increases the risk? This condition is more likely to develop in men:  Who have unprotected sex with more than one partner.  Who have anal sex.  Who have recently had surgery.  Who have a urinary catheter.  Who have urinary problems.  Who have a suppressed immune system. What are the signs or symptoms? This condition usually begins suddenly with chills, fever, and pain behind the scrotum and in the testicle. Other symptoms include:  Swelling of the scrotum, testicle, or both.  Pain whenejaculatingor urinating.  Pain in the back or belly.  Nausea.  Itching and discharge from the penis.  Frequent need to pass urine.  Redness and tenderness of the scrotum. How is this diagnosed? Your health care provider can diagnose this condition based on your symptoms and medical history. Your health care provider will also do a physical exam to ask about your symptoms and check your scrotum and testicle for swelling, pain, and redness. You may also have other tests, including:  Examination of discharge from the penis.  Urine tests for infections, such as STDs. Your health care provider may test you for other STDs, including HIV. How is this treated? Treatment for this condition depends on the cause. If your condition is caused by a bacterial infection, oral antibiotic medicine may be  prescribed. If the bacterial infection has spread to your blood, you may need to receive IV antibiotics. Nonbacterial epididymitis is treated with home care that includes bed rest and elevation of the scrotum. Surgery may be needed to treat:  Bacterial epididymitis that causes pus to build up in the scrotum (abscess).  Chronic epididymitis that has not responded to other treatments. Follow  these instructions at home: Medicines   Take over-the-counter and prescription medicines only as told by your health care provider.  If you were prescribed an antibiotic medicine, take it as told by your health care provider. Do not stop taking the antibiotic even if your condition improves. Sexual Activity   If your epididymitis was caused by an STD, avoid sexual activity until your treatment is complete.  Inform your sexual partner or partners if you test positive for an STD. They may need to be treated.Do not engage in sexual activity with your partner or partners until their treatment is completed. General instructions   Return to your normal activities as told by your health care provider. Ask your health care provider what activities are safe for you.  Keep your scrotum elevated and supported while resting. Ask your health care provider if you should wear a scrotal support, such as a jockstrap. Wear it as told by your health care provider.  If directed, apply ice to the affected area:  Put ice in a plastic bag.  Place a towel between your skin and the bag.  Leave the ice on for 20 minutes, 2-3 times per day.  Try taking a sitz bath to help with discomfort. This is a warm water bath that is taken while you are sitting down. The water should only come up to your hips and should cover your buttocks. Do this 3-4 times per day or as told by your health care provider.  Keep all follow-up visits as told by your health care provider. This is important. Contact a health care provider if:  You have a fever.  Your pain medicine is not helping.  Your pain is getting worse.  Your symptoms do not improve within three days. This information is not intended to replace advice given to you by your health care provider. Make sure you discuss any questions you have with your health care provider. Document Released: 05/04/2000 Document Revised: 10/13/2015 Document Reviewed:  09/22/2014 Elsevier Interactive Patient Education  2017 [employer].

## 2016-08-16 NOTE — Assessment & Plan Note (Signed)
Establish problem Taking Zatia 10 mg daily without intolerance Check LDL-C Intolerant of high potency atorvastatin in past.  No improvement with Red yeasrice.  Continues on fish oil

## 2016-08-16 NOTE — Assessment & Plan Note (Signed)
Diagnosis of Epistaxisanterior septal irritation, prominent anterior septal vessels, allergic rhinitis  Treatment of Epistaxis None. laboratory tests: CBC, platelets, PT, PTT, nasal  steroid, normal saline solution

## 2016-08-17 LAB — CMP14+EGFR
ALT: 62 IU/L — AB (ref 0–44)
AST: 38 IU/L (ref 0–40)
Albumin/Globulin Ratio: 1.6 (ref 1.2–2.2)
Albumin: 4.3 g/dL (ref 3.5–5.5)
Alkaline Phosphatase: 57 IU/L (ref 39–117)
BUN/Creatinine Ratio: 18 (ref 9–20)
BUN: 16 mg/dL (ref 6–24)
Bilirubin Total: 0.4 mg/dL (ref 0.0–1.2)
CO2: 23 mmol/L (ref 18–29)
CREATININE: 0.91 mg/dL (ref 0.76–1.27)
Calcium: 9.3 mg/dL (ref 8.7–10.2)
Chloride: 99 mmol/L (ref 96–106)
GFR, EST AFRICAN AMERICAN: 121 mL/min/{1.73_m2} (ref >59)
GFR, EST NON AFRICAN AMERICAN: 105 mL/min/{1.73_m2} (ref >59)
Globulin, Total: 2.7 g/dL (ref 1.5–4.5)
Glucose: 94 mg/dL (ref 65–99)
POTASSIUM: 4.2 mmol/L (ref 3.5–5.2)
Sodium: 139 mmol/L (ref 134–144)
TOTAL PROTEIN: 7 g/dL (ref 6.0–8.5)

## 2016-08-17 LAB — CBC
HEMOGLOBIN: 15.9 g/dL (ref 13.0–17.7)
Hematocrit: 47.9 % (ref 37.5–51.0)
MCH: 30.1 pg (ref 26.6–33.0)
MCHC: 33.2 g/dL (ref 31.5–35.7)
MCV: 91 fL (ref 79–97)
Platelets: 245 10*3/uL (ref 150–379)
RBC: 5.29 x10E6/uL (ref 4.14–5.80)
RDW: 14.2 % (ref 12.3–15.4)
WBC: 4.8 10*3/uL (ref 3.4–10.8)

## 2016-08-17 LAB — LDL CHOLESTEROL, DIRECT: LDL Direct: 142 mg/dL — ABNORMAL HIGH (ref 0–99)

## 2016-08-20 ENCOUNTER — Encounter: Payer: Self-pay | Admitting: Family Medicine

## 2016-08-27 ENCOUNTER — Encounter: Payer: Self-pay | Admitting: Family Medicine

## 2016-08-28 ENCOUNTER — Other Ambulatory Visit: Payer: Self-pay | Admitting: Family Medicine

## 2016-08-28 ENCOUNTER — Telehealth: Payer: Self-pay

## 2016-08-28 NOTE — Addendum Note (Signed)
Addended by: Steva Colder on: 08/28/2016 10:16 AM   Modules accepted: Orders

## 2016-08-28 NOTE — Telephone Encounter (Signed)
Wife was advised. ep

## 2016-08-28 NOTE — Telephone Encounter (Signed)
Attempted to contact pt and inform him of his [place] apt, pt did not not answer and a VM was left to call the office. If pt calls back please inform him of [place] apt scheduled 4/19 at [place] at 945am. Please tell him nothing to eat or drink after midnight the night before apt.

## 2016-09-03 NOTE — Progress Notes (Signed)
Your LDL (bad) cholesterol is lower from 7 months ago.  Good Job!  Your Liver test called ALT continues to be a little high.  This is from increase fat in your liver and your Prediabetes condition causing inflammation of the liver. The ordered liver ultrasound with elastrography will help see if the liver inflammation is causing any scarring called liver cirrhosis.    The best treatment for your liver and Prediabetes would be to lose about 5% of your body weight.  This would mean getting your weight down to about 195 pounds by healthy eating and a moderate amount of exercise besides your work.    If you are interesting in learning how to eat healthier, call the [facility] Nutritionist, Dr Wyona Almas, to arrange a possible visit with her.  Her office phone number is [phone].  She sees patients at the [facility].

## 2016-09-06 ENCOUNTER — Ambulatory Visit (HOSPITAL_COMMUNITY)
Admission: RE | Admit: 2016-09-06 | Discharge: 2016-09-06 | Disposition: A | Discharge status: Home / Self Care | Payer: BLUE CROSS/BLUE SHIELD | Source: Ambulatory Visit | Attending: Family Medicine | Admitting: Family Medicine

## 2016-09-06 DIAGNOSIS — K760 Fatty (change of) liver, not elsewhere classified: Secondary | ICD-10-CM | POA: Insufficient documentation

## 2016-09-09 ENCOUNTER — Other Ambulatory Visit: Payer: Self-pay | Admitting: Family Medicine

## 2016-09-12 ENCOUNTER — Encounter: Payer: Self-pay | Admitting: Family Medicine

## 2016-09-14 ENCOUNTER — Telehealth: Payer: Self-pay | Admitting: Family Medicine

## 2016-09-14 NOTE — Telephone Encounter (Signed)
Discussed results of liver elastography showing increased risk of liver damage for fattylivier  Intervention for now is 5 to 7 % weight loss then repeat US lastrography in 6 to 12 months.

## 2016-10-01 ENCOUNTER — Encounter: Payer: Self-pay | Admitting: Family Medicine

## 2016-10-04 ENCOUNTER — Encounter: Payer: BLUE CROSS/BLUE SHIELD | Admitting: Family Medicine

## 2016-10-28 ENCOUNTER — Ambulatory Visit (INDEPENDENT_AMBULATORY_CARE_PROVIDER_SITE_OTHER): Payer: BLUE CROSS/BLUE SHIELD

## 2016-10-28 ENCOUNTER — Encounter (HOSPITAL_COMMUNITY): Payer: Self-pay | Admitting: *Deleted

## 2016-10-28 ENCOUNTER — Ambulatory Visit (HOSPITAL_COMMUNITY)
Admission: EM | Admit: 2016-10-28 | Discharge: 2016-10-28 | Disposition: A | Discharge status: Home / Self Care | Payer: BLUE CROSS/BLUE SHIELD | Attending: Family Medicine | Admitting: Family Medicine

## 2016-10-28 DIAGNOSIS — S62660A Nondisplaced fracture of distal phalanx of right index finger, initial encounter for closed fracture: Secondary | ICD-10-CM

## 2016-10-28 DIAGNOSIS — S61309A Unspecified open wound of unspecified finger with damage to nail, initial encounter: Secondary | ICD-10-CM

## 2016-10-28 MED ORDER — IBUPROFEN 600 MG PO TABS: 600.0000 mg | ORAL_TABLET | Freq: Four times a day (QID) | ORAL | 0 refills | Status: DC | PRN

## 2016-10-28 MED ORDER — LIDOCAINE HCL 2 % IJ SOLN
INTRAMUSCULAR | Status: AC
  Filled 2016-10-28: qty 20

## 2016-10-28 NOTE — Discharge Instructions (Signed)
It was nice seeing you today. We were able to trim your nail. Please call Dr. Merrilee SeashoreKuzma's office tomorrow to take a loot at your right index finger fracture. Use Ibuprofen as needed for pain. Call if you have any question.

## 2016-10-28 NOTE — ED Triage Notes (Signed)
Pt  Reports    Laceration to  r      Pointer  Finger   Today   While   Working    On  A  Car     Nail  Bed  Involvement

## 2016-10-28 NOTE — ED Provider Notes (Addendum)
[facility]    CSN: [id number] Arrival date & time: 10/28/16  1750     History   Chief Complaint Chief Complaint  Patient presents with  . Laceration    HPI Calvin Johnston is a 41 y.o. male.   The history is provided by the patient. No language interpreter was used.  Right index finger injury: He was he got his finger caught up in a fly wheel while he was working on a car. The bolt hit him in his right index finger really hard. This happened 2 hours ago. He has pain of 10/10 in severity. He is unable to move his right index finger. He is concern it might be fractured.  Past Medical History:  Diagnosis Date  . Carpal tunnel syndrome of right wrist 08/2014  . Cubital tunnel syndrome on right 10/09/2013  . High cholesterol   . History of cervical fracture 07/2006   C2  . Hypertension    under control with med., per wife; has been on med. x 4 yr.  . Nonalcoholic fatty liver disease 10/09/2013  . Other abnormal glucose 08/16/2016  . Paresthesia of upper and lower extremities of both sides 10/27/2015  . Paresthesias 10/27/2015  . 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 Skin biopsied.    Marland Kitchen PONV (postoperative nausea and vomiting)   . 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 Other abnormal glucose 08/16/2016  . Epistaxis 08/16/2016  . Nonspecific abnormal electrocardiogram (ECG) (EKG) 08/16/2016  . Paresthesias 10/27/2015  . Prediabetes 05/07/2014  . Pure hypercholesterolemia 10/09/2013  . Nonalcoholic fatty liver disease 10/09/2013  . Obesity 10/28/2006  . Hypertension     Past Surgical History:  Procedure Laterality Date  . CARPAL TUNNEL RELEASE Right  09/09/2014   Procedure: RIGHT WRIST CARPAL TUNNEL RELEASE;  Surgeon: Mckinley Jewel, MD;  Location: [facility];  Service: Orthopedics;  Laterality: Right;  . ELBOW SURGERY Left 2008   nerve repair  . KNEE ARTHROSCOPY Left 2007  . LACERATION REPAIR  age 54   internal jugular, ear - in [place]       Home Medications    Prior to Admission medications   Medication Sig Start Date End Date Taking? Authorizing Provider  amLODipine (NORVASC) 5 MG tablet TAKE 1 TABLET (5 MG TOTAL) BY MOUTH DAILY. 09/11/16   McDiarmid, Leighton Roach, MD  ezetimibe (ZETIA) 10 MG tablet Take 1 tablet (10 mg total) by mouth daily. 01/16/16   McDiarmid, Leighton Roach, MD  fluticasone (FLONASE) 50 MCG/ACT nasal spray Place 2 sprays into both nostrils daily. 08/16/16   McDiarmid, Leighton Roach, MD  hydrochlorothiazide (HYDRODIURIL) 25 MG tablet TAKE 1 TABLET (25 MG TOTAL) BY MOUTH DAILY. 06/18/16   McDiarmid, Leighton Roach, MD  Multiple Vitamin (MULTIVITAMIN) tablet Take 1 tablet by mouth daily.    [provider]  Omega-3 Fatty Acids (FISH OIL PO) Take by mouth.    [provider]  TRIAMCINOLONE ACETONIDE, TOP, 0.05 % OINT Apply to right hand twice daily 01/11/16   Joanna Puff, MD    Family History Family History  Problem Relation Age of Onset  . Diabetes Mother   .  Hyperlipidemia Mother   . Thrombosis Mother        Thromboembolism clotting disorder in mother ? superficial veins  . Hypertension Father   . Stroke Father   . Seizures Cousin     Social History Social History  Substance Use Topics  . Smoking status: Never Smoker  . Smokeless tobacco: Never Used  . Alcohol use No     Allergies   Codeine   Review of Systems Review of Systems  Respiratory: Negative.   Cardiovascular: Negative.   Musculoskeletal:       Right index finger injury  All other systems reviewed and are negative.    Physical Exam Triage Vital Signs ED Triage Vitals [10/28/16 1824]  Enc Vitals Group     BP (!)  142/86     Pulse Rate 72     Resp 14     Temp 98.6 F (37 C)     Temp Source Oral     SpO2 100 %     Weight      Height      Head Circumference      Peak Flow      Pain Score 6     Pain Loc      Pain Edu?      Excl. in GC?    No data found.   Updated Vital Signs BP (!) 142/86 (BP Location: Right Arm)   Pulse 72   Temp 98.6 F (37 C) (Oral)   Resp 14   SpO2 100%   Visual Acuity Right Eye Distance:   Left Eye Distance:   Bilateral Distance:    Right Eye Near:   Left Eye Near:    Bilateral Near:     Physical Exam  Constitutional: He appears well-developed. No distress.  Cardiovascular: Normal rate and regular rhythm.   No murmur heard. Pulses:      Radial pulses are 2+ on the right side, and 2+ on the left side.  Pulmonary/Chest: Effort normal and breath sounds normal. No respiratory distress. He has no wheezes.  Abdominal: Soft. Bowel sounds are normal. There is no tenderness.  Musculoskeletal:       Right hand: He exhibits normal range of motion, no tenderness and no swelling.       Left hand: He exhibits normal range of motion, no tenderness, normal capillary refill and no deformity.       Hands: Nursing note and vitals reviewed.    UC Treatments / Results  Labs (all labs ordered are listed, but only abnormal results are displayed) Labs Reviewed - No data to display  EKG  EKG Interpretation None       Radiology No results found.  Procedures .Nail Removal Date/Time: 10/28/2016 7:41 PM Performed by: Janit PaganENIOLA, KEHINDE T Authorized by: Janit PaganENIOLA, KEHINDE T   Consent:    Consent obtained:  Verbal   Consent given by:  Patient   Risks discussed:  Bleeding, incomplete removal, infection, pain and permanent nail deformity   Alternatives discussed:  No treatment Location:    Hand:  R index finger Pre-procedure details:    Skin preparation:  Betadine Anesthesia (see MAR for exact dosages):    Anesthesia method:  Local infiltration   Local anesthetic:   Lidocaine 2% w/o epi Nail Removal:    Nail removed:  Partial   Nail bed repaired: no   Trephination:    Subungual hematoma drained: no   Ingrown nail:    Wedge excision of skin: no  Nail matrix removed or ablated:  None Nails trimmed:    Number of nails trimmed:  1 Post-procedure details:    Dressing:  Xeroform gauze   Patient tolerance of procedure:  Tolerated well, no immediate complications Comments:     Open tip soft finger splint applied    (including critical care time)  Medications Ordered in UC Medications - No data to display   Initial Impression / Assessment and Plan / UC Course  I have reviewed the triage vital signs and the nursing notes.  Pertinent labs & imaging results that were available during my care of the patient were reviewed by me and considered in my medical decision making (see chart for details).  Clinical Course as of Oct 29 1629  Sun Oct 28, 2016  2021 Partial nail avulsion trimmed under sterile condition. Xeroform gauze applied.  [KE]  2022 Partial, non-displaced fracture of his right index finger. I called and discussed with the oncall hand specialist Dr. Merlyn Lot. He recommended applying xeroform gauze and finger splint. He will like to see patient in their office this week. I gave patient appointment scheduling instruction. Ibuprofen prescribed for pain  [KE]  2024 We attempted to apply splint without success. We dressed him with xerofoam with gauze bandage wrap. F/U hand surgeon to call them tomorrow.  [KE]  Mon Oct 29, 2016  1631 Note: He showed me his vaccination record during visit. Last Tdap was Dec 2015 less than 3 yrs ago.  [KE]    Clinical Course User Index [KE] Doreene Eland, MD      Final Clinical Impressions(s) / UC Diagnoses   Final diagnoses:  None  Dg Hand Complete Right  Result Date: 10/28/2016 CLINICAL DATA:  Injury to the right hand EXAM: RIGHT HAND - COMPLETE 3+ VIEW COMPARISON:  None. FINDINGS: Nail bed  injury of the second distal digit. Questionable linear lucency in the tuft of the distal digit on the lateral view. No subluxation or radiopaque foreign body. IMPRESSION: 1. Soft tissue injury to the dorsal aspect of second distal phalanx. Questionable underlying nondisplaced subtle fracture involving the tuft of the second distal phalanx. Electronically Signed   By: Jasmine Pang M.D.   On: 10/28/2016 19:31     New Prescriptions New Prescriptions   No medications on file     Doreene Eland, MD 10/28/16 2025    Doreene Eland, MD 10/29/16 [phone]

## 2016-10-28 NOTE — ED Notes (Signed)
DRESSING APPLIED BY Arcola JanskyLIVIA  SNEED

## 2016-12-07 ENCOUNTER — Other Ambulatory Visit: Payer: Self-pay | Admitting: Family Medicine

## 2016-12-11 ENCOUNTER — Other Ambulatory Visit (HOSPITAL_COMMUNITY)
Admission: RE | Admit: 2016-12-11 | Discharge: 2016-12-11 | Disposition: A | Discharge status: Home / Self Care | Payer: BLUE CROSS/BLUE SHIELD | Source: Ambulatory Visit | Attending: Family Medicine | Admitting: Family Medicine

## 2016-12-11 ENCOUNTER — Ambulatory Visit (INDEPENDENT_AMBULATORY_CARE_PROVIDER_SITE_OTHER): Payer: BLUE CROSS/BLUE SHIELD | Admitting: Internal Medicine

## 2016-12-11 ENCOUNTER — Encounter: Payer: Self-pay | Admitting: Internal Medicine

## 2016-12-11 DIAGNOSIS — R1032 Left lower quadrant pain: Secondary | ICD-10-CM | POA: Diagnosis not present

## 2016-12-11 HISTORY — DX: Left lower quadrant pain: R10.32

## 2016-12-11 LAB — POCT URINALYSIS DIP (MANUAL ENTRY)
BILIRUBIN UA: NEGATIVE
Blood, UA: NEGATIVE
GLUCOSE UA: NEGATIVE mg/dL
Ketones, POC UA: NEGATIVE mg/dL
Leukocytes, UA: NEGATIVE
Nitrite, UA: NEGATIVE
Protein Ur, POC: NEGATIVE mg/dL
SPEC GRAV UA: 1.02 (ref 1.010–1.025)
UROBILINOGEN UA: 0.2 U/dL
pH, UA: 7 (ref 5.0–8.0)

## 2016-12-11 MED ORDER — DICLOFENAC SODIUM 75 MG PO TBEC: 75.0000 mg | DELAYED_RELEASE_TABLET | Freq: Two times a day (BID) | ORAL | 0 refills | Status: DC

## 2016-12-11 NOTE — Patient Instructions (Signed)
I am going to give you diclofenac. You can take this twice daily for the groin pain. I believe this maybe a kidney stone which could take several days to pass. Please drinking plenty of water. I will let you know the results of your test in the couple of days. If the pain does not improve please follow up in before your trip.  If pain worsens significantly you develop fevers or nausea or vomiting , please go to the ED

## 2016-12-11 NOTE — Progress Notes (Signed)
   [facility] Noralee CharsAsiyah Mikell, MD Phone: [phone]  Reason For Visit: SDA for Groin Pain   # SDA for Groin pain.   About 1 week, little bit of groin pain. Not evaluated at that time. 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 Took antibotic and felt better per patient. This pain is different from that pain about 1 month ago.  No fever or chills. Pain is about 5/6 currently -if patient moves or bends over like a 10  No hx of kidney stones  No penile discharge  No concern about STDs per patient   Pain radiates to the lower back  History of trauma or injury: None  Prior history of similar pain: None  No fevers, no nausea or vomiting  Medications: tylenol and ibuprofen did not help  No hx of cancer: None    Past Medical History Reviewed problem list.  Medications- reviewed and updated No additions to family history Social history- patient is a non- smoker  Objective: BP (!) 130/92   Pulse 87   Temp 98.1 F (36.7 C) (Oral)   Wt 207 lb (93.9 kg)   BMI 34.45 kg/m  Gen: NAD, alert, cooperative with exam Cardio: regular rate and rhythm, S1S2 heard, no murmurs appreciated Pulm: clear to auscultation bilaterally, no wheezes, rhonchi or rales GI: soft, non-tender, non-distended, bowel sounds present, Tenderness in the left lower pelvic region, otherwise non-tender throughout  GU:  Testicles descended bilaterally, no no masses, no lesions, nontender, no penile lesions noted no discharge from urethra scrotum without induration or erythema or edema no hernias palpated in the inguinal canals indirect or direct Back: Tenderness along the sacral joint of left side. No CVA tenderness   Assessment/Plan: See problem based  a/p  Groin pain, left Groin pain, differential includes kidney or bladder infection. Patient could have a kidney stone though lack of radiation up towards kidney makes this less likely. UA obtained no signs of blood or infection making both kidney stone or UTI unlikely. Given lack of penile discharge and patient in a monogamous relationship be unlikely to be STD causing groin pain though we will check for this. 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 Therefore, most likely musculoskeletal and given association with lower back pain, more convincing diagnosis  - POCT urinalysis dipstick - Urine cytology ancillary only - Urine Culture - Diclofenac for pain, patient advise can not take any other NSAIDs with this, however can take tylenol  - Follow up in two days if no improvement

## 2016-12-12 ENCOUNTER — Telehealth: Payer: Self-pay | Admitting: Internal Medicine

## 2016-12-12 LAB — URINE CYTOLOGY ANCILLARY ONLY
CHLAMYDIA, DNA PROBE: NEGATIVE
NEISSERIA GONORRHEA: NEGATIVE

## 2016-12-12 NOTE — Telephone Encounter (Signed)
Called patient to let him know results are negative and to check on his groin pain. He did not answer the call so left a message, asked him to call the clinic and let me how his pain was feeling. Please call patient again and check on his pain   Calvin Johnston

## 2016-12-13 NOTE — Assessment & Plan Note (Signed)
Groin pain, differential includes kidney or bladder infection. Patient could have a kidney stone though lack of radiation up towards kidney makes this less likely. UA obtained no signs of blood or infection making both kidney stone or UTI unlikely. Given lack of penile discharge and patient in a monogamous relationship be unlikely to be STD causing groin pain though we will check for this. 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 Therefore, most likely musculoskeletal and given association with lower back pain, more convincing diagnosis  - POCT urinalysis dipstick - Urine cytology ancillary only - Urine Culture - Diclofenac for pain, patient advise can not take any other NSAIDs with this, however can take tylenol  - Follow up in two days if no improvement

## 2016-12-14 LAB — URINE CULTURE

## 2017-01-15 ENCOUNTER — Other Ambulatory Visit: Payer: Self-pay | Admitting: *Deleted

## 2017-01-15 MED ORDER — DICLOFENAC SODIUM 75 MG PO TBEC: 75.0000 mg | DELAYED_RELEASE_TABLET | Freq: Two times a day (BID) | ORAL | 0 refills | Status: DC

## 2017-01-15 NOTE — Telephone Encounter (Signed)
Pharmacy faxing to request refill of:  Name of Medication(s):  Diclofenac  Last date of OV: 12/11/16  Pharm[facility]  Will forward to MD.  Feliz Beam

## 2017-02-06 ENCOUNTER — Encounter: Payer: Self-pay | Admitting: Family Medicine

## 2017-02-09 ENCOUNTER — Encounter: Payer: Self-pay | Admitting: Family Medicine

## 2017-02-15 ENCOUNTER — Other Ambulatory Visit: Payer: Self-pay | Admitting: Family Medicine

## 2017-02-15 DIAGNOSIS — I10 Essential (primary) hypertension: Secondary | ICD-10-CM

## 2017-02-15 DIAGNOSIS — Z6830 Body mass index (BMI) 30.0-30.9, adult: Secondary | ICD-10-CM

## 2017-02-15 DIAGNOSIS — R0683 Snoring: Secondary | ICD-10-CM | POA: Insufficient documentation

## 2017-02-15 DIAGNOSIS — E669 Obesity, unspecified: Secondary | ICD-10-CM

## 2017-02-15 HISTORY — DX: Snoring: R06.83

## 2017-02-15 NOTE — Progress Notes (Signed)
Referral to Sleep Specialist physician at [facility] for patient's snoring, hypertension and obesity.

## 2017-03-06 ENCOUNTER — Other Ambulatory Visit: Payer: Self-pay | Admitting: Family Medicine

## 2017-03-21 ENCOUNTER — Other Ambulatory Visit: Payer: Self-pay | Admitting: Family Medicine

## 2017-04-08 ENCOUNTER — Encounter: Payer: Self-pay | Admitting: Neurology

## 2017-04-08 ENCOUNTER — Ambulatory Visit (INDEPENDENT_AMBULATORY_CARE_PROVIDER_SITE_OTHER): Payer: BLUE CROSS/BLUE SHIELD | Admitting: Neurology

## 2017-04-08 VITALS — BP 140/94 | HR 96 | Ht 65.0 in | Wt 213.5 lb

## 2017-04-08 DIAGNOSIS — G479 Sleep disorder, unspecified: Secondary | ICD-10-CM | POA: Diagnosis not present

## 2017-04-08 DIAGNOSIS — R0681 Apnea, not elsewhere classified: Secondary | ICD-10-CM

## 2017-04-08 DIAGNOSIS — E669 Obesity, unspecified: Secondary | ICD-10-CM | POA: Diagnosis not present

## 2017-04-08 DIAGNOSIS — R0683 Snoring: Secondary | ICD-10-CM | POA: Diagnosis not present

## 2017-04-08 DIAGNOSIS — R351 Nocturia: Secondary | ICD-10-CM

## 2017-04-08 NOTE — Patient Instructions (Signed)

## 2017-04-08 NOTE — Progress Notes (Signed)
Subjective:    Patient ID: Calvin Johnston is a 41 y.o. male.  HPI     Huston Foley, MD, PhD [facility] [address], [address] [address] [place], [place] [address]  Dear Dr. Perley Jain,   I saw your patient, Calvin Johnston, upon your kind request in my neurologic clinic today for initial consultation of his sleep disorder, in particular, concern for underlying obstructive sleep apnea. The patient is accompanied by his wife and daughter today. As you know, Calvin Johnston is a 41 year old right-handed gentleman with an underlying medical history of hypertension, hyperlipidemia, nonalcoholic fatty liver disease, abnormal glucose/prediabetes, abnormal EKG, recurrent nosebleeds, and obesity, who reports snoring and excessive daytime somnolence. I reviewed your office note from 08/16/2016. His Epworth sleepiness score is 12 out of 24, fatigue score is 25 out of 63. His snoring can be loud. His sleep is disrupted. He lives with his wife and daughter and works as a Scientist, water quality. He makes snorting and choking sounds during sleep, per wife. He falls asleep when sedentary. He denies morning headaches. He has nocturia typically once per average night. His wife notes that he is a restless sleeper and tosses and turns a lot. He seems to wake himself up with a snort or gasp at times. Bedtime is around 9:30 or 10, wakeup time between 5 and 5:30 typically. He is a nonsmoker and does not drink alcohol typically. He does not drink caffeine on a daily basis. He was able to lose weight a couple of years ago but gained some back after his father died and he had a lot of stress surrounding his mother's health afterwards.  His Past Medical History Is Significant For: Past Medical History:  Diagnosis Date  . Carpal tunnel syndrome of right wrist 08/2014  . Cubital tunnel syndrome on right 10/09/2013  . High cholesterol   . History of cervical fracture 07/2006   C2  . Hypertension    under control with med.,  per wife; has been on med. x 4 yr.  . Nonalcoholic fatty liver disease 10/09/2013  . Other abnormal glucose 08/16/2016  . Paresthesia of upper and lower extremities of both sides 10/27/2015  . Paresthesias 10/27/2015  . 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 Skin biopsied.    Marland Kitchen PONV (postoperative nausea and vomiting)   . 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 ELBOW SURGERY Left 2008   nerve repair  . KNEE ARTHROSCOPY Left 2007  . LACERATION REPAIR  age 89   internal jugular, ear - in [place]  . RIGHT WRIST CARPAL TUNNEL RELEASE Right 09/09/2014   Performed by Mckinley Jewel, MD at [facility]    His Family History Is Significant For: Family History  Problem Relation Age of Onset  . Diabetes Mother   . Hyperlipidemia Mother   . Thrombosis Mother        Thromboembolism clotting disorder in mother ? superficial veins  . Hypertension Father   . Stroke Father   . Seizures Cousin     His Social History Is Significant For: Social History   Socioeconomic History  . Marital  status: Married    Spouse name: Kenney Houseman  . Number of children: 3  . Years of education: 61  . Highest education level: None  Social Needs  . Financial resource strain: None  . Food insecurity - worry: None  . Food insecurity - inability: None  . Transportation needs - medical: None  . Transportation needs - non-medical: None  Occupational History  . Occupation: Mason  Tobacco Use  . Smoking status: Never Smoker  . Smokeless tobacco: Never Used  Substance and Sexual Activity  . Alcohol use: No    Alcohol/week: 0.0 oz  . Drug use: No  .  Sexual activity: Yes    Partners: Male  Other Topics Concern  . None  Social History Narrative   Married, lives with wife, dgt, and step-dgt.   Occupation: [employer].    Uninsured   Does not smoke   Does not drink alcohol   Primary Language: Spanish, speaks and understands spoken English adequately.   Does not read [place] well.     His Allergies Are:  Allergies  Allergen Reactions  . Codeine Itching  :   His Current Medications Are:  Outpatient Encounter Medications as of 40/19/2018  Medication Sig  . amLODipine (NORVASC) 5 MG tablet TAKE 1 TABLET (5 MG TOTAL) BY MOUTH DAILY.  Marland Kitchen ezetimibe (ZETIA) 10 MG tablet TAKE 1 TABLET BY MOUTH DAILY.  . fluticasone (FLONASE) 50 MCG/ACT nasal spray Place 2 sprays into both nostrils daily.  . hydrochlorothiazide (HYDRODIURIL) 25 MG tablet TAKE 1 TABLET (25 MG TOTAL) BY MOUTH DAILY.  . Multiple Vitamin (MULTIVITAMIN) tablet Take 1 tablet by mouth daily.  . Omega-3 Fatty Acids (FISH OIL PO) Take by mouth.  . [DISCONTINUED] diclofenac (VOLTAREN) 75 MG EC tablet Take 1 tablet (75 mg total) by mouth 2 (two) times daily.  . [DISCONTINUED] TRIAMCINOLONE ACETONIDE, TOP, 0.05 % OINT Apply to right hand twice daily   No facility-administered encounter medications on file as of 04/08/2017.   :  Review of Systems:  Out of a complete 14 point review of systems, all are reviewed and negative with the exception of these symptoms as listed below: Review of Systems  Neurological:       Patient's wife states that he snores a lot. Patient feels very tired during the day.   Epworth Sleepiness Scale 0= would never doze 1= slight chance of dozing 2= moderate chance of dozing 3= high chance of dozing  Sitting and reading:3 Watching TV:2 Sitting inactive in a public place (ex. Theater or meeting):2 As a passenger in a car for an hour without a break:0 Lying down to rest in the afternoon:2 Sitting and talking to someone:0 Sitting quietly after lunch  (no alcohol):1 In a car, while stopped in traffic:2 Total:12     Objective:  Neurological Exam  Physical Exam Physical Examination:   Vitals:   04/08/17 1545  BP: (!) 140/94  Pulse: 96    General Examination: The patient is a very pleasant 41 y.o. male in no acute distress. He appears well-developed and well-nourished and adequately groomed.   HEENT: Normocephalic, atraumatic, pupils are equal, round and reactive to light and accommodation. Corrective eye glasses in place. Extraocular tracking is good without limitation to gaze excursion or nystagmus noted. Normal smooth pursuit is noted. Hearing is grossly intact. Face is symmetric with normal facial animation and normal facial sensation. Speech is clear with no dysarthria noted. There is no hypophonia. There is no lip, neck/head, jaw or voice tremor.  Neck is supple with full range of passive and active motion. There are no carotid bruits on auscultation.scar around right ear and scar at the right neck area from injury as a child and surgery.  Oropharynx exam reveals: mild mouth dryness, adequate dental hygiene and moderate airway crowding, due to smaller airway entry and redundant soft palate, tonsils in place. Mallampati is class II. Tongue protrudes centrally and palate elevates symmetrically. Tonsils are 1+ to 2+ in size. Neck size is 19 inches. He has a minimal overbite.   Chest: Clear to auscultation without wheezing, rhonchi or crackles noted.  Heart: S1+S2+0, regular and normal without murmurs, rubs or gallops noted.   Abdomen: Soft, non-tender and non-distended with normal bowel sounds appreciated on auscultation.  Extremities: There is no pitting edema in the distal lower extremities bilaterally. Pedal pulses are intact.  Skin: Warm and dry without trophic changes noted.  Musculoskeletal: exam reveals no obvious joint deformities, tenderness or joint swelling or erythema.   Neurologically:  Mental status: The patient is  awake, alert and oriented in all 4 spheres. His immediate and remote memory, attention, language skills and fund of knowledge are appropriate. There is no evidence of aphasia, agnosia, apraxia or anomia. Speech is clear with normal prosody and enunciation. Thought process is linear. Mood is normal and affect is normal.  Cranial nerves II - XII are as described above under HEENT exam. In addition: shoulder shrug is normal with equal shoulder height noted. Motor exam: Normal bulk, strength and tone is noted. There is no drift, tremor or rebound. Romberg is negative. Reflexes are 2+ throughout. Fine motor skills and coordination: intact with normal finger taps, normal hand movements, normal rapid alternating patting, normal foot taps and normal foot agility.  Cerebellar testing: No dysmetria or intention tremor.There is no truncal or gait ataxia.  Sensory exam: intact to light touch in the upper and lower extremities.  Gait, station and balance: He stands easily. No veering to one side is noted. No leaning to one side is noted. Posture is age-appropriate and stance is narrow based. Gait shows normal stride length and normal pace. No problems turning are noted. Tandem walk is unremarkable.            Assessment and Plan:  In summary, Calvin Johnston is a very pleasant 41 y.o.-year old male with an underlying medical history of hypertension, hyperlipidemia, nonalcoholic fatty liver disease, abnormal glucose/prediabetes, abnormal EKG, recurrent nosebleeds, and obesity, whose history and physical exam are concerning for obstructive sleep apnea (OSA). I had a long chat with the patient and his wife about my findings and the diagnosis of OSA, its prognosis and treatment options. We talked about medical treatments, surgical interventions and non-pharmacological approaches. I explained in particular the risks and ramifications of untreated moderate to severe OSA, especially with respect to developing cardiovascular  disease down the Road, including congestive heart failure, difficult to treat hypertension, cardiac arrhythmias, or stroke. Even type 2 diabetes has, in part, been linked to untreated OSA. Symptoms of untreated OSA include daytime sleepiness, memory problems, mood irritability and mood disorder such as depression and anxiety, lack of energy, as well as recurrent headaches, especially morning headaches. We talked about trying to maintain a healthy lifestyle in general, as well as the importance of weight control. I encouraged the patient to eat healthy, exercise daily and keep well hydrated, to keep a scheduled bedtime and wake time routine, to not skip any meals and eat healthy snacks in between meals. I advised  the patient not to drive when feeling sleepy. I recommended the following at this time: sleep study with potential positive airway pressure titration. (We will score hypopneas at 3%).   I explained the sleep test procedure to the patient and also outlined possible surgical and non-surgical treatment options of OSA, including the use of a custom-made dental device (which would require a referral to a specialist dentist or oral surgeon), upper airway surgical options, such as pillar implants, radiofrequency surgery, tongue base surgery, and UPPP (which would involve a referral to an ENT surgeon). Rarely, jaw surgery such as mandibular advancement may be considered.  I also explained the CPAP treatment option to the patient, who indicated that he would be willing to try CPAP if the need arises. I explained the importance of being compliant with PAP treatment, not only for insurance purposes but primarily to improve His symptoms, and for the patient's long term health benefit, including to reduce His cardiovascular risks. I answered all his questions today and the patient was in agreement. I would like to see him back after the sleep study is completed and encouraged him to call with any interim questions,  concerns, problems or updates.   Thank you very much for allowing me to participate in the care of this nice patient. If I can be of any further assistance to you please do not hesitate to call me at [phone].  Sincerely,   Huston FoleySaima Janette Harvie, MD, PhD

## 2017-04-10 ENCOUNTER — Telehealth: Payer: Self-pay

## 2017-04-10 DIAGNOSIS — R0681 Apnea, not elsewhere classified: Secondary | ICD-10-CM

## 2017-04-10 NOTE — Telephone Encounter (Signed)
HST order placed. 

## 2017-04-10 NOTE — Telephone Encounter (Signed)
In-lab study denied per [employer]. Need HST order. 

## 2017-04-22 ENCOUNTER — Ambulatory Visit (INDEPENDENT_AMBULATORY_CARE_PROVIDER_SITE_OTHER): Payer: BLUE CROSS/BLUE SHIELD | Admitting: Neurology

## 2017-04-22 DIAGNOSIS — G4733 Obstructive sleep apnea (adult) (pediatric): Secondary | ICD-10-CM

## 2017-04-22 DIAGNOSIS — R0681 Apnea, not elsewhere classified: Secondary | ICD-10-CM

## 2017-05-03 ENCOUNTER — Telehealth: Payer: Self-pay

## 2017-05-03 ENCOUNTER — Other Ambulatory Visit: Payer: Self-pay | Admitting: Neurology

## 2017-05-03 DIAGNOSIS — G4733 Obstructive sleep apnea (adult) (pediatric): Secondary | ICD-10-CM

## 2017-05-03 NOTE — Procedures (Signed)
  [facility] [address] [place], [place] [address] NAME:  [name]                                           DOB: [birth year] MEDICAL RECORD [id number]                        DOS:04/22/17 REFERRING PHYSICIAN: Tawanna Coolerodd McDiarmid, MD STUDY PERFORMED: HST HISTORY: 41 year old man with a history of hypertension, hyperlipidemia, nonalcoholic fatty liver disease, abnormal glucose/prediabetes, abnormal EKG, recurrent nosebleeds, and obesity, who reports snoring and excessive daytime somnolence.   STUDY RESULTS: Total Recording Time: 6 h, 44 minutes Total Apnea/Hypopnea Index (AHI):  8.8/hour Average Oxygen Saturation:  91% Lowest Oxygen Saturation:   84%  Total Time Oxygen Saturation Below or at 88%: 11 min (3%) Average Heart Rate: 62 bpm  IMPRESSION: OSA RECOMMENDATION: This home sleep test demonstrates overall mild obstructive sleep apnea with a total AHI of 8.8/hour and O2 nadir of 84%. Given the patient's medical history and sleep related complaints, treatment with positive airway pressure is a reasonable choice. This can be achieved in the form of autoPAP trial/titration at home. A full night CPAP titration study will help with proper treatment settings and mask fitting if needed. Please note that untreated obstructive sleep apnea carries additional perioperative morbidity. Patients with significant obstructive sleep apnea should receive perioperative PAP therapy and the surgeons and particularly the anesthesiologist should be informed of the diagnosis and the severity of the sleep disordered breathing. The patient should be cautioned not to drive, work at heights, or operate dangerous or heavy equipment when tired or sleepy. Review and reiteration of good sleep hygiene measures should be pursued with any patient. Other causes of the patient's symptoms, including circadian rhythm disturbances, an underlying mood disorder, medication effect and/or an underlying  medical problem cannot be ruled out based on this test. Clinical correlation is recommended. The patient and his referring provider will be notified of the test results. [facility]  I certify that I have reviewed the raw data recording prior to the issuance of this report in accordance with the standards of the American Academy of Sleep Medicine (AASM). Huston FoleySaima Rinaldo Macqueen, MD, PhD [facility]) Diplomat, ABPN (Neurology and Sleep)

## 2017-05-03 NOTE — Telephone Encounter (Signed)
I called pt to discuss his sleep study results. No answer, left a message asking him to call me back. 

## 2017-05-03 NOTE — Progress Notes (Signed)
Patient referred by Dr. Perley JainMcDiarmid, seen by me on 04/08/17, HST on 04/22/17:      Please call and notify the patient that the recent HST shows Mild OSA. I recommend treatment for this in the form of autoPAP, which means, that we can arrange for an autoPAP machine at home, through a DME company (of his choice, or as per insurance requirement). The DME representative will educate him on how to use the machine, how to put the mask on, etc. I have placed an order in the chart. Please send referral, talk to patient, send report to PCP and referring MD. We will need a FU in sleep clinic for 10 weeks post-PAP set up, please arrange that as well. Thanks,   Huston FoleySaima Kiyoshi Schaab, MD, PhD [facility])

## 2017-05-03 NOTE — Telephone Encounter (Signed)
-----   Message from Huston FoleySaima Athar, MD sent at 05/03/2017  8:38 AM EST ----- Patient referred by Dr. Perley JainMcDiarmid, seen by me on 04/08/17, HST on 04/22/17:      Please call and notify the patient that the recent HST shows Mild OSA. I recommend treatment for this in the form of autoPAP, which means, that we can arrange for an autoPAP machine at home, through a DME company (of his choice, or as per insurance requirement). The DME representative will educate him on how to use the machine, how to put the mask on, etc. I have placed an order in the chart. Please send referral, talk to patient, send report to PCP and referring MD. We will need a FU in sleep clinic for 10 weeks post-PAP set up, please arrange that as well. Thanks,   Huston FoleySaima Athar, MD, PhD [facility])

## 2017-05-06 NOTE — Telephone Encounter (Signed)
I called pt's wife. I advised pt's wife that Dr. Athar reviewed pt's sleep studFrances Furbishy results and found that pt has mild osa. Dr. Frances FurbishAthar recommends that pt start an auto pap machine. I reviewed PAP compliance expectations with the pt's wife. Pt's wife is agreeable to pt starting an auto-PAP. I advised pt's wife that an order will be sent to a DME, [facility], and [facility] will call the pt within about one week after they file with the pt's insurance. [facility] will show the pt how to use the machine, fit for masks, and troubleshoot the auto-PAP if needed. A follow up appt was made for insurance purposes with Dr. Frances FurbishAthar on 08/05/17 at 3:00pm. Pt's wife verbalized understanding to arrive 15 minutes early and bring their auto-PAP. A letter with all of this information in it will be mailed to the pt as a reminder. I verified with the pt's wife that the address we have on file is correct. Pt's wife verbalized understanding of results. Pt's wife had no questions at this time but was encouraged to call back if questions arise.

## 2017-06-03 ENCOUNTER — Other Ambulatory Visit: Payer: Self-pay

## 2017-06-03 MED ORDER — AMLODIPINE BESYLATE 5 MG PO TABS: 5.0000 mg | ORAL_TABLET | Freq: Every day | ORAL | 0 refills | Status: DC

## 2017-07-01 ENCOUNTER — Telehealth: Payer: Self-pay | Admitting: Family Medicine

## 2017-07-01 ENCOUNTER — Other Ambulatory Visit: Payer: Self-pay | Admitting: *Deleted

## 2017-07-01 MED ORDER — HYDROCHLOROTHIAZIDE 25 MG PO TABS: 25.0000 mg | ORAL_TABLET | Freq: Every day | ORAL | 3 refills | Status: DC

## 2017-07-01 NOTE — Telephone Encounter (Signed)
**  After Hours/ Emergency Line Call**  Received a call to report that Calvin Johnston is having a headache. Spoke with patient and wife Calvin Johnston.  States that had a headache yesterday for which he took tylenol and went away. Tonight had sudden onset of R sided headache from the top of his head and feels like a trickling sensation in his head "like a nosebleed but no bleeding" and goes down his face. Similar to headache he had in the past in 2009, he does not recall the treatment he had at that time. Wife states that he is walking normally, talking normally, no facial droop, no slurred speech.  No falls. No trauma. He denies and vision changes, numbness. Recommended that he be seen in the ED but he refused and asked for clinic appointment tomorrow preferably late in the day. Appointment made for 4:15pm tomorrow with Dr. Mosetta PuttFeng with the caveat that if he headache is intractable, develops neuro deficits such as weakness, confusion, slurred speech, numbness that they would head to the ED emergently. She voiced good understanding and patient agreed. Will forward to PCP and Dr. Mosetta PuttFeng.  Calvin HerElsia J Rebeca Valdivia, DO PGY-2, [facility] 07/01/2017 8:30 PM

## 2017-07-02 ENCOUNTER — Ambulatory Visit: Payer: BLUE CROSS/BLUE SHIELD | Admitting: Student in an Organized Health Care Education/Training Program

## 2017-07-12 IMAGING — DX DG HAND COMPLETE 3+V*R*
3 series · 3 of 3 positions shown · non-contrast
Comparison: None.

CLINICAL DATA: Injury to the right hand

EXAM:
RIGHT HAND - COMPLETE 3+ VIEW

[hand pa]
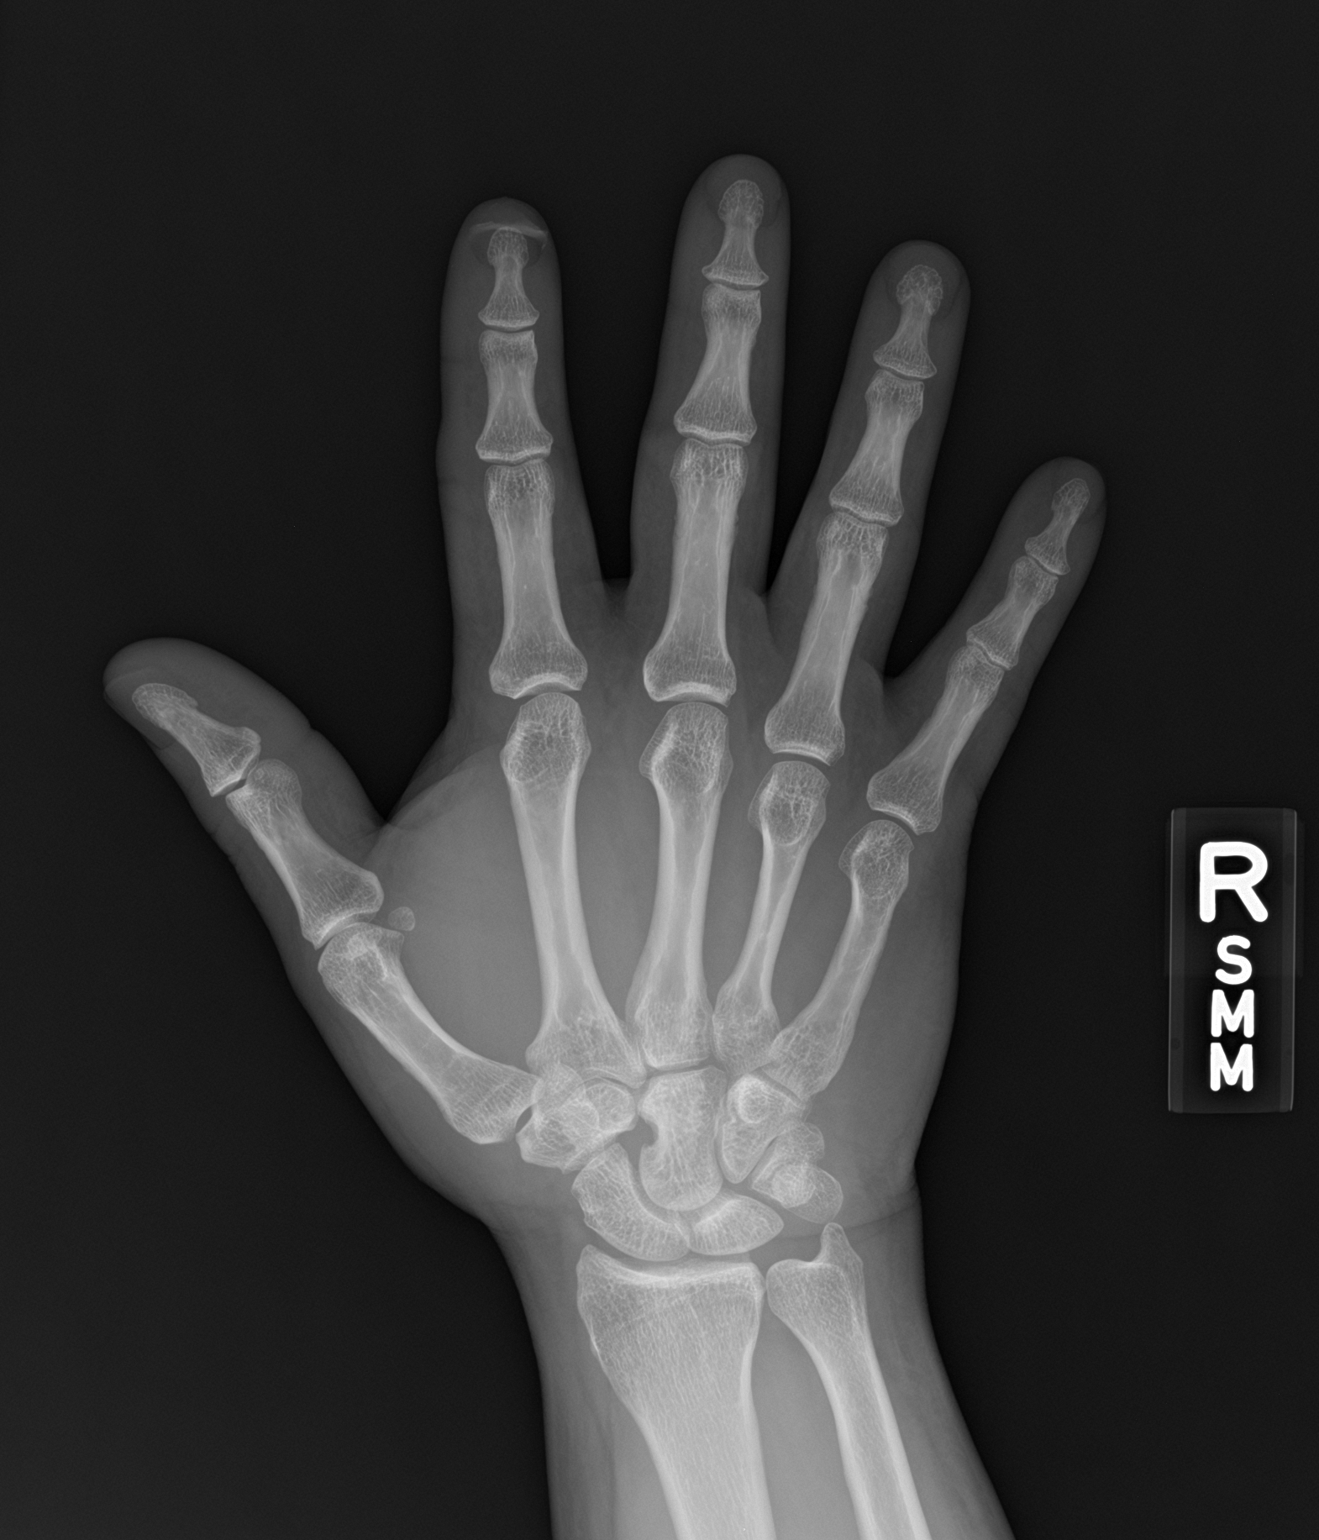

[hand obl]
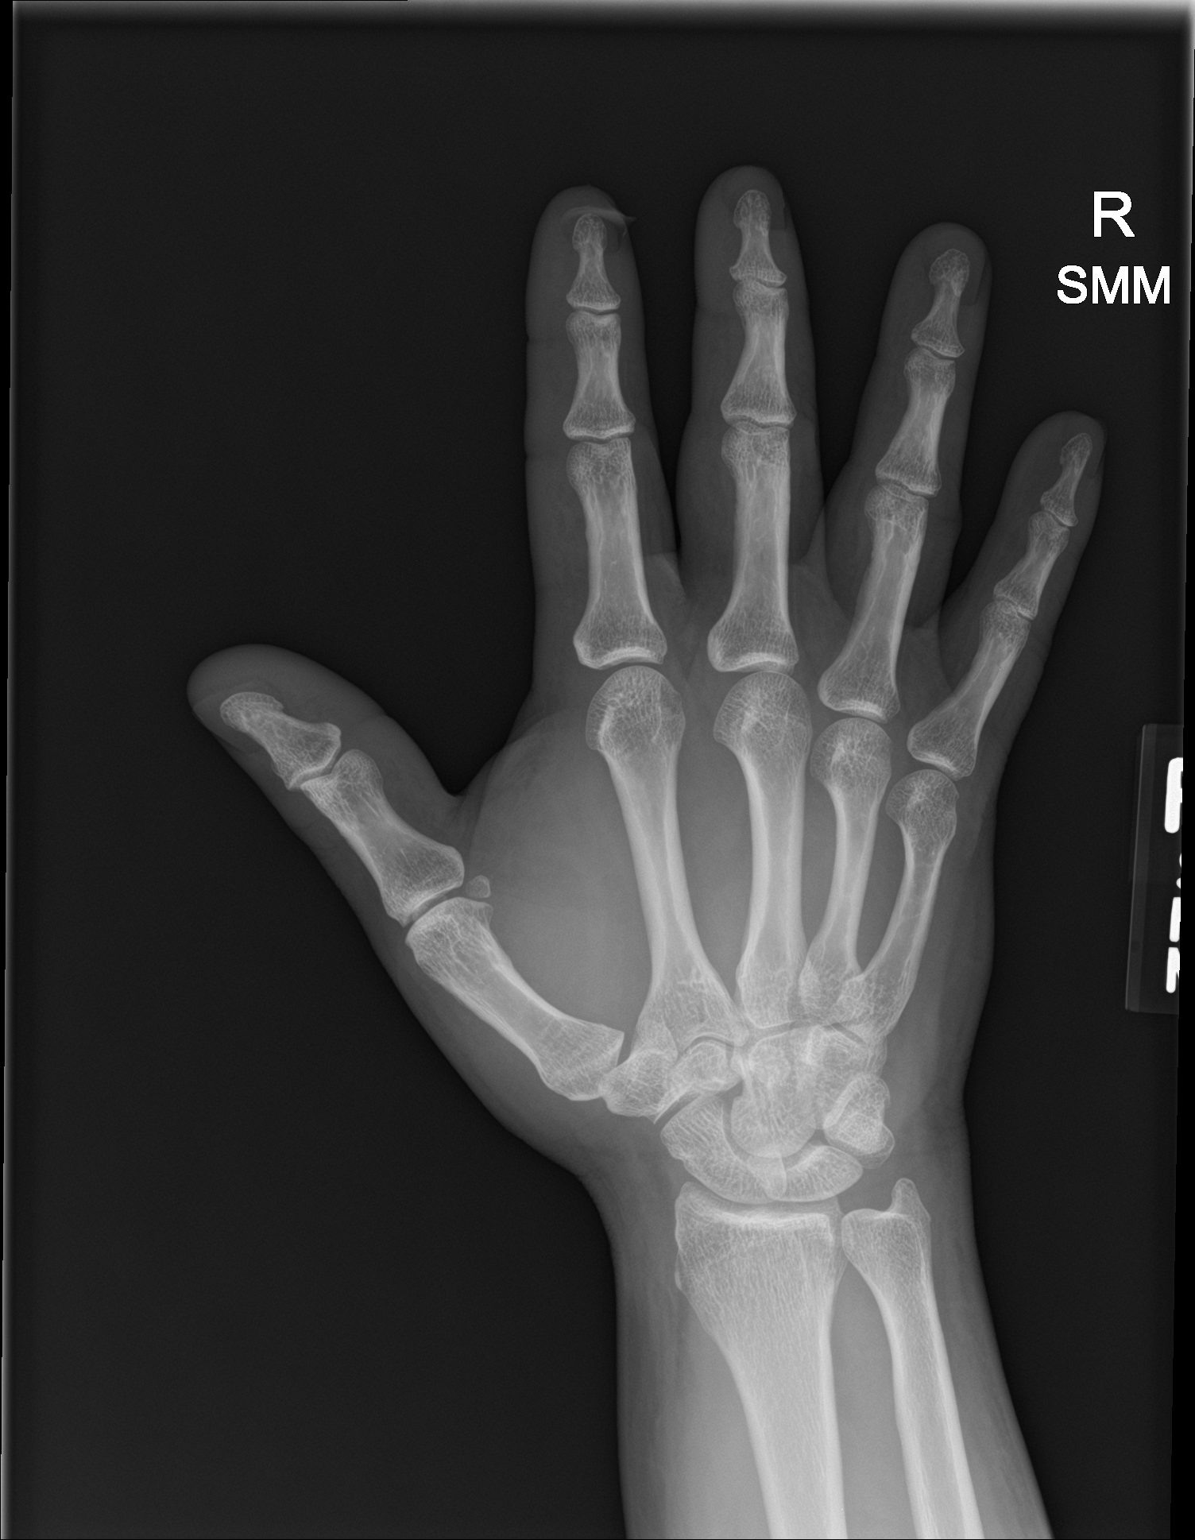

[hand lat]
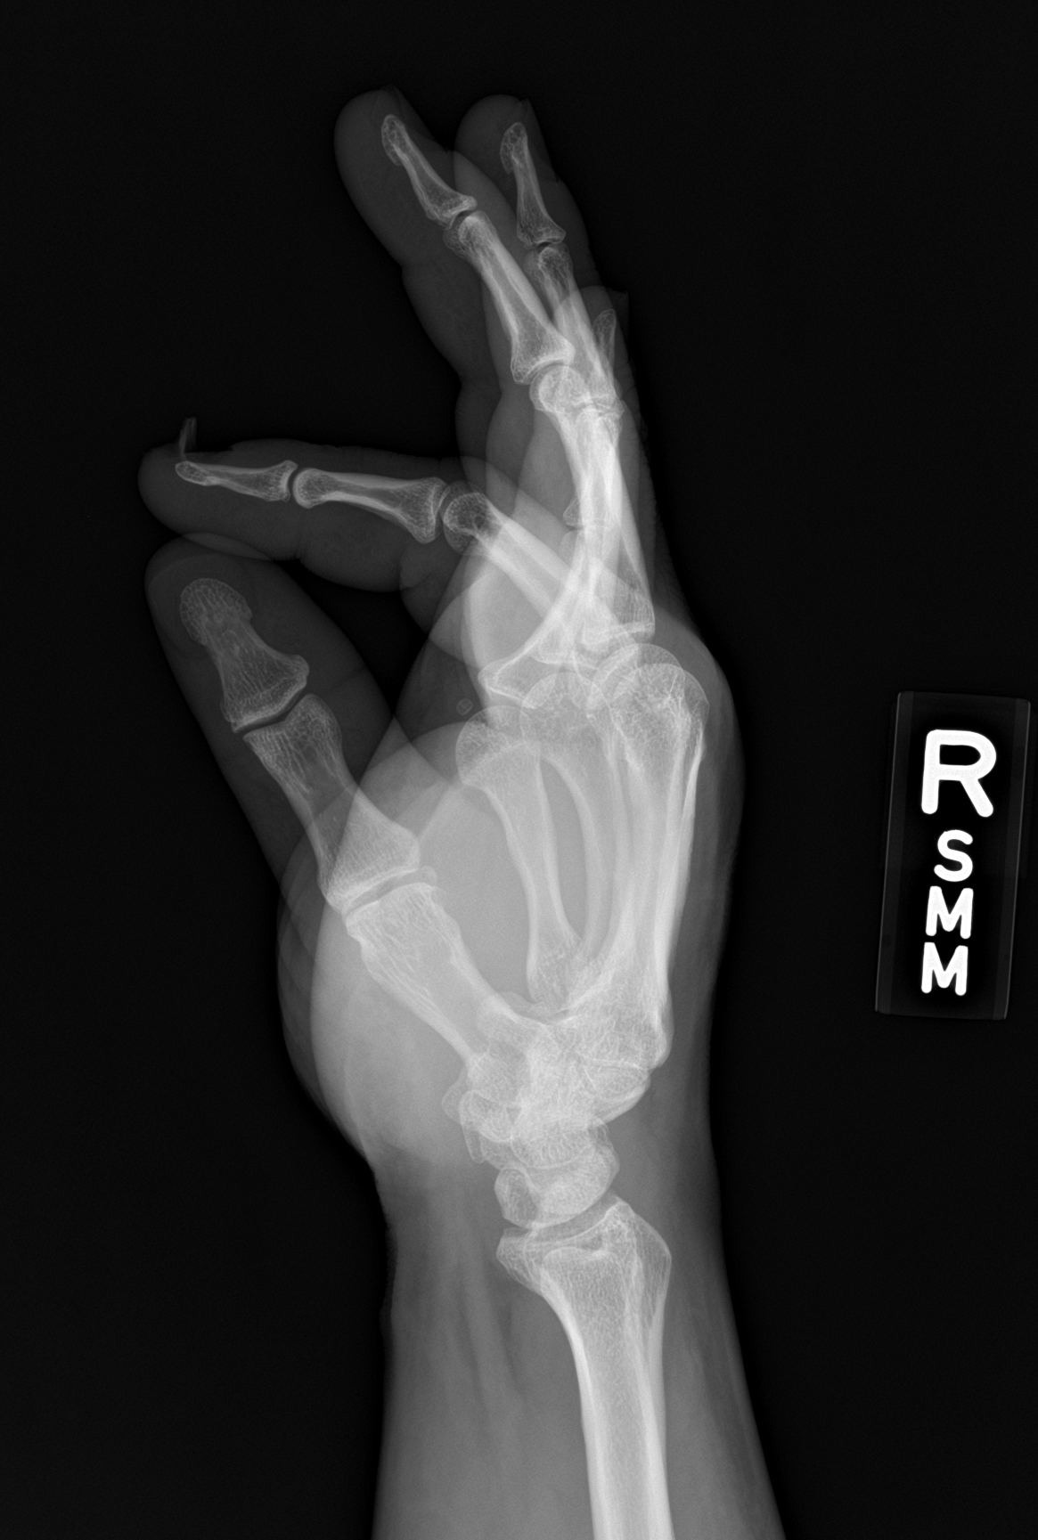

[3 of 3 positions shown; findings below may reference images not displayed]

FINDINGS: Nail bed injury of the second distal digit. Questionable linear
lucency in the tuft of the distal digit on the lateral view. No
subluxation or radiopaque foreign body.
IMPRESSION: 1. Soft tissue injury to the dorsal aspect of second distal phalanx.
Questionable underlying nondisplaced subtle fracture involving the
tuft of the second distal phalanx.

## 2017-07-31 ENCOUNTER — Encounter: Payer: Self-pay | Admitting: Neurology

## 2017-08-05 ENCOUNTER — Telehealth: Payer: Self-pay

## 2017-08-05 ENCOUNTER — Ambulatory Visit: Payer: Self-pay | Admitting: Neurology

## 2017-08-05 NOTE — Telephone Encounter (Signed)
Pt did not show for their appt with Dr. Athar today.  

## 2017-08-07 ENCOUNTER — Encounter: Payer: Self-pay | Admitting: Neurology

## 2017-08-31 ENCOUNTER — Other Ambulatory Visit: Payer: Self-pay | Admitting: Family Medicine

## 2017-12-07 ENCOUNTER — Other Ambulatory Visit: Payer: Self-pay | Admitting: Family Medicine

## 2018-03-10 ENCOUNTER — Other Ambulatory Visit: Payer: Self-pay | Admitting: Family Medicine

## 2018-06-02 ENCOUNTER — Ambulatory Visit
Admission: EM | Admit: 2018-06-02 | Discharge: 2018-06-02 | Disposition: A | Discharge status: Home / Self Care | Payer: Self-pay | Attending: Family Medicine | Admitting: Family Medicine

## 2018-06-02 DIAGNOSIS — R200 Anesthesia of skin: Secondary | ICD-10-CM | POA: Insufficient documentation

## 2018-06-02 NOTE — ED Notes (Signed)
Chem 8 Na  139 K  3.4 Cl  103 iCa  1.14 TCO2  24 Glucose 89 Creat  0.8 BUN  19 Hematocrit 49 Hemoglobin 16.7

## 2018-06-02 NOTE — Discharge Instructions (Signed)
Continue the same medicines See your PCP in follow up Your exam and lab work are normal, but you need additional testing if the symptoms persist

## 2018-06-02 NOTE — ED Triage Notes (Signed)
Pt c/o "head numbness" since 05/22/2018, states has SOB off and on

## 2018-06-05 ENCOUNTER — Other Ambulatory Visit: Payer: Self-pay | Admitting: Family Medicine

## 2018-06-05 DIAGNOSIS — I10 Essential (primary) hypertension: Secondary | ICD-10-CM

## 2018-06-06 NOTE — Telephone Encounter (Signed)
Refill amlodipine sent.  Patient will need visit with [facility] physician before further refills.

## 2018-07-23 ENCOUNTER — Encounter: Payer: Self-pay | Admitting: Family Medicine

## 2018-07-23 ENCOUNTER — Other Ambulatory Visit: Payer: Self-pay | Admitting: Family Medicine

## 2018-07-23 DIAGNOSIS — I10 Essential (primary) hypertension: Secondary | ICD-10-CM

## 2018-07-23 DIAGNOSIS — E669 Obesity, unspecified: Secondary | ICD-10-CM

## 2018-07-23 DIAGNOSIS — E8881 Metabolic syndrome: Secondary | ICD-10-CM

## 2018-07-23 DIAGNOSIS — G4733 Obstructive sleep apnea (adult) (pediatric): Secondary | ICD-10-CM

## 2018-07-23 HISTORY — DX: Obesity, unspecified: E66.9

## 2018-07-23 HISTORY — DX: Metabolic syndrome: E88.810

## 2018-07-23 HISTORY — DX: Metabolic syndrome: E88.81

## 2018-10-21 ENCOUNTER — Other Ambulatory Visit: Payer: Self-pay | Admitting: Family Medicine

## 2018-10-21 DIAGNOSIS — I10 Essential (primary) hypertension: Secondary | ICD-10-CM

## 2018-10-27 ENCOUNTER — Telehealth: Payer: Self-pay | Admitting: Family Medicine

## 2018-10-27 NOTE — Telephone Encounter (Signed)
**  After Hours/ Emergency Line Call**  Received a call to report that [facility] complaining of fever and headache for 3 days. Endorsing improves with ibuprofen. Fever up to 100.1. Denying tired, fatigue, SOB, cough.  Recommended that patient call MD in AM or go to ER if getting worse, but from the sounds of it, that patient was improving.  Red flags discussed.  Will forward to PCP.      Bonnita Hollow, MD PGY-2, [facility] 10/27/2018 6:50 PM

## 2018-10-28 ENCOUNTER — Telehealth: Payer: Self-pay | Admitting: *Deleted

## 2018-10-28 ENCOUNTER — Other Ambulatory Visit: Payer: Self-pay

## 2018-10-28 ENCOUNTER — Telehealth (INDEPENDENT_AMBULATORY_CARE_PROVIDER_SITE_OTHER): Payer: Self-pay | Admitting: Family Medicine

## 2018-10-28 DIAGNOSIS — R6889 Other general symptoms and signs: Secondary | ICD-10-CM

## 2018-10-28 DIAGNOSIS — Z20822 Contact with and (suspected) exposure to covid-19: Secondary | ICD-10-CM

## 2018-10-28 LAB — NOVEL CORONAVIRUS, NAA: SARS-CoV-2, NAA: NOT DETECTED

## 2018-10-28 NOTE — Assessment & Plan Note (Signed)
No respiratory symptoms, but given diffuse joint pain and high fever over 102, discussed with patient I advised that he get a drive-by coronavirus test.  Staff message sent to the [facility] community pool for this, patient aware they will call him for his test.  We went over isolation requirements of at least 7 days and symptoms began, at least 3 days since last fever without Tylenol, at least 3 days since last respiratory symptom.  If COVID test comes back negative, patient be able to go back to work as soon as he feels well

## 2018-10-28 NOTE — Progress Notes (Signed)
[facility] Visit  Patient consented to have virtual visit. Method of visit: Video was attempted, but technology challenges prevented patient from using video, so visit was conducted via telephone.  Encounter participants: Patient: Calvin Johnston - located at home with wife Provider: Sherene Sires - located at [facility] clinic Others (if applicable): n/a  Chief Complaint: Fever  HPI: Patient was interviewed over the phone because video visit did not work.  Patient's wife is present.  Patient stated that he started having fevers on Saturday, he checked his temperature and found that he was over 102.  This was largely consistent for most of Saturday and Sunday.  Starting on Monday his fevers became intermittent and he recorded them as high as the low 100s.  We discussed how I 100.4 is the technical definition of fever and given this patient says he has been afebrile since waking up on Monday morning.  At moments when his temperature has been higher he says that is when he is been having these joint aches, these are diffuse with no swelling.  He is not exercising muscle aches as much as joint aches.  Does not 1 joint over any other.  He has had 0 respiratory symptoms has had no cough no sore throat no vision changes no bowel or urinary problems no trouble eating or swallowing.  He would like to know when he safe back to go back to work and would like a work note.  ROS: per HPI  Pertinent PMHx: Does have a history of hypertension hyperlipidemia  Exam:  Respiratory: Speaking in full sentences no respiratory distress on this phone.,  No cough during our interview.  He is well spoken and thinking clearly.  Assessment/Plan:  Suspected Covid-19 Virus Infection No respiratory symptoms, but given diffuse joint pain and high fever over 102, discussed with patient I advised that he get a drive-by coronavirus test.  Staff message sent to the [facility] community pool for this, patient  aware they will call him for his test.  We went over isolation requirements of at least 7 days and symptoms began, at least 3 days since last fever without Tylenol, at least 3 days since last respiratory symptom.  If COVID test comes back negative, patient be able to go back to work as soon as he feels well   Will write work note.  Time spent during visit with patient: 15 minutes

## 2018-10-28 NOTE — Addendum Note (Signed)
Addended by: Denman George on: 10/28/2018 11:12 AM   Modules accepted: Orders

## 2018-10-28 NOTE — Telephone Encounter (Signed)
Pt was referred for COVID-19 testing by Dr. Sherene Sires of [place] having symptoms.  I scheduled him for today at 11:00 at the [facility] location in [place].   I instructed him to stay in his car and wear a mask.   He would get his results back in 24-72 hours.

## 2018-10-29 ENCOUNTER — Telehealth: Payer: Self-pay | Admitting: Student in an Organized Health Care Education/Training Program

## 2018-10-31 ENCOUNTER — Encounter: Payer: Self-pay | Admitting: Family Medicine

## 2018-10-31 ENCOUNTER — Telehealth: Payer: Self-pay | Admitting: Family Medicine

## 2018-10-31 NOTE — Telephone Encounter (Signed)
Pt wife is calling to check on the status of pt COVID testing.   Please call when results are available

## 2018-10-31 NOTE — Telephone Encounter (Signed)
Will forward to MD to advise.  Results are not back yet.  Calvin Johnston,CMA

## 2018-11-03 NOTE — Telephone Encounter (Signed)
mychart message sent to patient with results.  Makye Radle,CMA

## 2018-11-03 NOTE — Telephone Encounter (Signed)
I was informed by [facility] lab that the CoViD test for Calvin Johnston was negative.  No evidence of CoViD infection.  Please let Calvin Johnston' wife know these results.

## 2018-11-04 ENCOUNTER — Encounter: Payer: Self-pay | Admitting: Hematology

## 2019-03-14 ENCOUNTER — Other Ambulatory Visit: Payer: Self-pay | Admitting: Family Medicine

## 2019-03-14 DIAGNOSIS — I10 Essential (primary) hypertension: Secondary | ICD-10-CM

## 2019-03-17 NOTE — Telephone Encounter (Signed)
Calvin Johnston must see physician at [facility] for his hypertension before further refills.

## 2019-03-18 NOTE — Telephone Encounter (Signed)
Attempted to reach pt. LVM for pt to make an appt with a physician hear at the clinic before  further refills can be made on any medications. Salvatore Marvel, CMA

## 2019-04-11 ENCOUNTER — Other Ambulatory Visit: Payer: Self-pay | Admitting: Family Medicine

## 2019-04-11 DIAGNOSIS — I10 Essential (primary) hypertension: Secondary | ICD-10-CM

## 2019-05-09 ENCOUNTER — Encounter: Payer: Self-pay | Admitting: Family Medicine

## 2019-05-12 ENCOUNTER — Other Ambulatory Visit: Payer: Self-pay | Admitting: Family Medicine

## 2019-05-12 DIAGNOSIS — I10 Essential (primary) hypertension: Secondary | ICD-10-CM

## 2019-05-12 MED ORDER — HYDROCHLOROTHIAZIDE 25 MG PO TABS: 25.0000 mg | ORAL_TABLET | Freq: Every day | ORAL | 0 refills | Status: DC

## 2019-05-12 MED ORDER — AMLODIPINE BESYLATE 5 MG PO TABS: ORAL_TABLET | ORAL | 0 refills | Status: DC

## 2019-05-12 NOTE — Progress Notes (Signed)
3 month supply amlodipine and of hydrochlorothiazide sent. Will revisit patient's need for ov or virtual visit in 3 months.

## 2019-05-12 NOTE — Assessment & Plan Note (Signed)
3 month refill of amlodipine and hydrochlorothiazide sent.  Will revisit question of following up hypertension by in-person or televisit in 3 months.

## 2019-08-03 ENCOUNTER — Other Ambulatory Visit: Payer: Self-pay | Admitting: Family Medicine

## 2019-08-03 DIAGNOSIS — I10 Essential (primary) hypertension: Secondary | ICD-10-CM

## 2019-10-19 ENCOUNTER — Emergency Department: Payer: Self-pay

## 2019-10-19 ENCOUNTER — Other Ambulatory Visit: Payer: Self-pay

## 2019-10-19 ENCOUNTER — Emergency Department
Admission: EM | Admit: 2019-10-19 | Discharge: 2019-10-19 | Disposition: A | Discharge status: Home / Self Care | Payer: Self-pay | Attending: Emergency Medicine | Admitting: Emergency Medicine

## 2019-10-19 ENCOUNTER — Encounter: Payer: Self-pay | Admitting: Emergency Medicine

## 2019-10-19 DIAGNOSIS — S6010XA Contusion of unspecified finger with damage to nail, initial encounter: Secondary | ICD-10-CM

## 2019-10-19 DIAGNOSIS — I10 Essential (primary) hypertension: Secondary | ICD-10-CM | POA: Insufficient documentation

## 2019-10-19 DIAGNOSIS — Z79899 Other long term (current) drug therapy: Secondary | ICD-10-CM | POA: Insufficient documentation

## 2019-10-19 DIAGNOSIS — Y999 Unspecified external cause status: Secondary | ICD-10-CM | POA: Insufficient documentation

## 2019-10-19 DIAGNOSIS — S6991XA Unspecified injury of right wrist, hand and finger(s), initial encounter: Secondary | ICD-10-CM

## 2019-10-19 DIAGNOSIS — S60111A Contusion of right thumb with damage to nail, initial encounter: Secondary | ICD-10-CM | POA: Insufficient documentation

## 2019-10-19 DIAGNOSIS — Y929 Unspecified place or not applicable: Secondary | ICD-10-CM | POA: Insufficient documentation

## 2019-10-19 DIAGNOSIS — F17200 Nicotine dependence, unspecified, uncomplicated: Secondary | ICD-10-CM | POA: Insufficient documentation

## 2019-10-19 DIAGNOSIS — Y939 Activity, unspecified: Secondary | ICD-10-CM | POA: Insufficient documentation

## 2019-10-19 DIAGNOSIS — W230XXA Caught, crushed, jammed, or pinched between moving objects, initial encounter: Secondary | ICD-10-CM | POA: Insufficient documentation

## 2019-10-19 MED ORDER — TRAMADOL HCL 50 MG PO TABS
50.0000 mg | ORAL_TABLET | Freq: Once | ORAL | Status: AC
  Administered 2019-10-19: 50 mg via ORAL
  Filled 2019-10-19: qty 1

## 2019-10-19 MED ORDER — KETOROLAC TROMETHAMINE 30 MG/ML IJ SOLN
30.0000 mg | Freq: Once | INTRAMUSCULAR | Status: AC
Start: 2019-10-19 — End: 2019-10-19
  Administered 2019-10-19: 30 mg via INTRAMUSCULAR
  Filled 2019-10-19: qty 1

## 2019-10-19 MED ORDER — TRAMADOL HCL 50 MG PO TABS: 50.0000 mg | ORAL_TABLET | Freq: Four times a day (QID) | ORAL | 0 refills | Status: DC | PRN

## 2019-10-19 NOTE — ED Triage Notes (Signed)
Pt reports he closed the right thumb into the car door. Area is busied and swollen.

## 2019-10-19 NOTE — ED Provider Notes (Signed)
[facility] Emergency Department Provider Note  ____________________________________________  Time seen: Approximately 9:31 PM  I have reviewed the triage vital signs and the nursing notes.   HISTORY  Chief Complaint Finger Injury    HPI Calvin Johnston is a 44 y.o. male that presents to the emergency department for evaluation of right thumb pain after slamming his hand in the car door.  He is able to move his thumb but with pain.  He thinks the door got caught between his thumb and his hand.  His thumb has been swelling so he came to the emergency department.  No additional injuries.   Past Medical History:  Diagnosis Date  . Abdominal obesity and metabolic syndrome 07/23/2018  . Carpal tunnel syndrome of right wrist 08/2014  . Cubital tunnel syndrome on right 10/09/2013  . Epistaxis 08/16/2016  . Groin pain, left 12/11/2016  . High cholesterol   . History of cervical fracture 07/2006   C2  . Hypertension    under control with med., per wife; has been on med. x 4 yr.  . Nonalcoholic fatty liver disease 10/09/2013  . Other abnormal glucose 08/16/2016  . Paresthesia of upper and lower extremities of both sides 10/27/2015  . Paresthesias 10/27/2015  . 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 Skin biopsied.    Marland Kitchen PONV (postoperative nausea and vomiting)   . 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 Snoring 02/15/2017  . 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 Suspected COVID-19 virus infection 10/28/2018  . Obstructive sleep apnea hypopnea, mild 07/23/2018  . Abdominal obesity and metabolic syndrome 07/23/2018  . Nonspecific abnormal electrocardiogram (ECG) (EKG) 08/16/2016  . Prediabetes  05/07/2014  . Pure hypercholesterolemia 10/09/2013  . Nonalcoholic fatty liver disease 10/09/2013  . Obesity, morbid (HCC) with Hypertension 10/28/2006  . Hypertension with Morbid Obesity     Past Surgical History:  Procedure Laterality Date  . CARPAL TUNNEL RELEASE Right 09/09/2014   Procedure: RIGHT WRIST CARPAL TUNNEL RELEASE;  Surgeon: Mckinley Jewel, MD;  Location: [facility];  Service: Orthopedics;  Laterality: Right;  . ELBOW SURGERY Left 2008   nerve repair  . KNEE ARTHROSCOPY Left 2007  . LACERATION REPAIR  age 22   internal jugular, ear - in [place]    Prior to Admission medications   Medication Sig Start Date End Date Taking? Authorizing Provider  amLODipine (NORVASC) 5 MG tablet TAKE 1 TABLET BY MOUTH EVERY DAY. MUST SEE MD FOR REFILLS 08/03/19   McDiarmid, Leighton Roach, MD  ezetimibe (ZETIA) 10 MG tablet TAKE 1 TABLET BY MOUTH DAILY. 03/22/17   McDiarmid, Leighton Roach, MD  fluticasone (FLONASE) 50 MCG/ACT nasal spray Place 2 sprays into both nostrils daily. 08/16/16   McDiarmid, Leighton Roach, MD  hydrochlorothiazide (HYDRODIURIL) 25 MG tablet TAKE 1 TABLET BY MOUTH EVERY DAY 08/03/19   McDiarmid, Leighton Roach, MD  Multiple Vitamin (MULTIVITAMIN) tablet Take 1 tablet by mouth daily.    [provider]  Omega-3 Fatty Acids (FISH OIL PO) Take by mouth.    [provider]  traMADol (ULTRAM) 50 MG tablet Take 1 tablet (50 mg total) by mouth every 6 (six) hours as needed. 10/19/19 10/18/20  Loreta Ave,  Morrie Sheldon, PA-C    Allergies Codeine  Family History  Problem Relation Age of Onset  . Diabetes Mother   . Hyperlipidemia Mother   . Thrombosis Mother        Thromboembolism clotting disorder in mother ? superficial veins  . Hypertension Father   . Stroke Father   . Seizures Cousin     Social History Social History   Tobacco Use  . Smoking status: Current Some Day Smoker  . Smokeless tobacco: Never Used  Substance Use Topics  . Alcohol use: No    Alcohol/week: 0.0  standard drinks  . Drug use: No     Review of Systems  Respiratory: No SOB. Gastrointestinal: No nausea, no vomiting.  Musculoskeletal: Positive for hand and thumb pain. Skin: Negative for rash, abrasions, lacerations, ecchymosis. Neurological: Negative for numbness or tingling   ____________________________________________   PHYSICAL EXAM:  VITAL SIGNS: ED Triage Vitals  Enc Vitals Group     BP 10/19/19 2029 (!) 166/100     Pulse Rate 10/19/19 2028 65     Resp 10/19/19 2028 17     Temp 10/19/19 2028 98 F (36.7 C)     Temp Source 10/19/19 2028 Oral     SpO2 10/19/19 2028 98 %     Weight --      Height --      Head Circumference --      Peak Flow --      Pain Score 10/19/19 2057 10     Pain Loc --      Pain Edu? --      Excl. in GC? --      Constitutional: Alert and oriented. Well appearing and in no acute distress. Eyes: Conjunctivae are normal. PERRL. EOMI. Head: Atraumatic. ENT:      Ears:      Nose: No congestion/rhinnorhea.      Mouth/Throat: Mucous membranes are moist.  Neck: No stridor.  Cardiovascular: Normal rate, regular rhythm.  Good peripheral circulation. Respiratory: Normal respiratory effort without tachypnea or retractions. Lungs CTAB. Good air entry to the bases with no decreased or absent breath sounds. Musculoskeletal: Full range of motion to all extremities. No gross deformities appreciated.  Mild swelling to right thumb.  Ecchymosis under right thumbnail. Neurologic:  Normal speech and language. No gross focal neurologic deficits are appreciated.  Skin:  Skin is warm, dry and intact. No rash noted. Psychiatric: Mood and affect are normal. Speech and behavior are normal. Patient exhibits appropriate insight and judgement.   ____________________________________________   LABS (all labs ordered are listed, but only abnormal results are displayed)  Labs Reviewed - No data to  display ____________________________________________  EKG   ____________________________________________  RADIOLOGY Lexine Baton, personally viewed and evaluated these images (plain radiographs) as part of my medical decision making, as well as reviewing the written report by the radiologist.  DG Hand Complete Right  Result Date: 10/19/2019 CLINICAL DATA:  Left hand pain. Close thumb in car door. Swelling and bruising. EXAM: RIGHT HAND - COMPLETE 3+ VIEW COMPARISON:  None. FINDINGS: There is no evidence of fracture or dislocation. There is no evidence of arthropathy or other focal bone abnormality. Soft tissue edema about thumb. No soft tissue air or radiopaque foreign body. IMPRESSION: Soft tissue edema about the thumb. No acute osseous abnormality. Electronically Signed   By: Narda Rutherford M.D.   On: 10/19/2019 20:48    ____________________________________________    PROCEDURES  Procedure(s) performed:    Procedures  DRAINAGE  Performed by: Laban Emperor Consent: Verbal consent obtained. Risks and benefits: risks, benefits and alternatives were discussed Type: subungual hematoma  Body area: thumb nail  Anesthesia: none  Hole was made with an 18G needle  Drainage: blood  Patient tolerance: Patient tolerated the procedure well with no immediate complications.    Medications  ketorolac (TORADOL) 30 MG/ML injection 30 mg (30 mg Intramuscular Given 10/19/19 2233)  traMADol (ULTRAM) tablet 50 mg (50 mg Oral Given 10/19/19 2232)     ____________________________________________   INITIAL IMPRESSION / ASSESSMENT AND PLAN / ED COURSE  Pertinent labs & imaging results that were available during my care of the patient were reviewed by me and considered in my medical decision making (see chart for details).  Review of the [place] CSRS was performed in accordance of the [place] prior to dispensing any controlled drugs.   Patient's diagnosis is consistent with hand injury  and subungual hematoma.  Vital signs and exam are reassuring.  X-ray negative for acute bony abnormalities.  Subungual hematoma was drained in the emergency department.  Patient was given IM Toradol and oral tramadol for pain.  Patient will be discharged home with prescriptions for tramadol.  Patient is to follow up with primary care or hand orthopedics as directed. Patient is given ED precautions to return to the ED for any worsening or new symptoms.   Calvin Johnston was evaluated in Emergency Department on 10/19/2019 for the symptoms described in the history of present illness. He was evaluated in the context of the global COVID-19 pandemic, which necessitated consideration that the patient might be at risk for infection with the SARS-CoV-2 virus that causes COVID-19. Institutional protocols and algorithms that pertain to the evaluation of patients at risk for COVID-19 are in a state of rapid change based on information released by regulatory bodies including the CDC and federal and state organizations. These policies and algorithms were followed during the patient's care in the ED.  ____________________________________________  FINAL CLINICAL IMPRESSION(S) / ED DIAGNOSES  Final diagnoses:  Injury of finger of right hand, initial encounter  Subungual hematoma of digit of hand, initial encounter      NEW MEDICATIONS STARTED DURING THIS VISIT:  ED Discharge Orders         Ordered    traMADol (ULTRAM) 50 MG tablet  Every 6 hours PRN     10/19/19 2254              This chart was dictated using voice recognition software/Dragon. Despite best efforts to proofread, errors can occur which can change the meaning. Any change was purely unintentional.    Laban Emperor, PA-C 10/19/19 2309    Earleen Newport, MD 10/20/19 1620

## 2019-10-25 ENCOUNTER — Encounter: Payer: Self-pay | Admitting: Family Medicine

## 2019-10-26 ENCOUNTER — Other Ambulatory Visit: Payer: Self-pay | Admitting: Family Medicine

## 2019-10-26 DIAGNOSIS — I10 Essential (primary) hypertension: Secondary | ICD-10-CM

## 2019-10-26 MED ORDER — AMLODIPINE BESYLATE 10 MG PO TABS: 10.0000 mg | ORAL_TABLET | Freq: Every day | ORAL | 3 refills | Status: DC

## 2019-10-26 NOTE — Progress Notes (Signed)
Increase dose Amlodipine from 5 mg daily to 10 mg daily for uncontrolled hypertension.

## 2020-07-02 IMAGING — DX DG HAND COMPLETE 3+V*R*
3 series · 3 of 3 positions shown · non-contrast
Comparison: None.

CLINICAL DATA: Left hand pain. Close thumb in car door. Swelling
and bruising.

EXAM:
RIGHT HAND - COMPLETE 3+ VIEW

[hand ap]
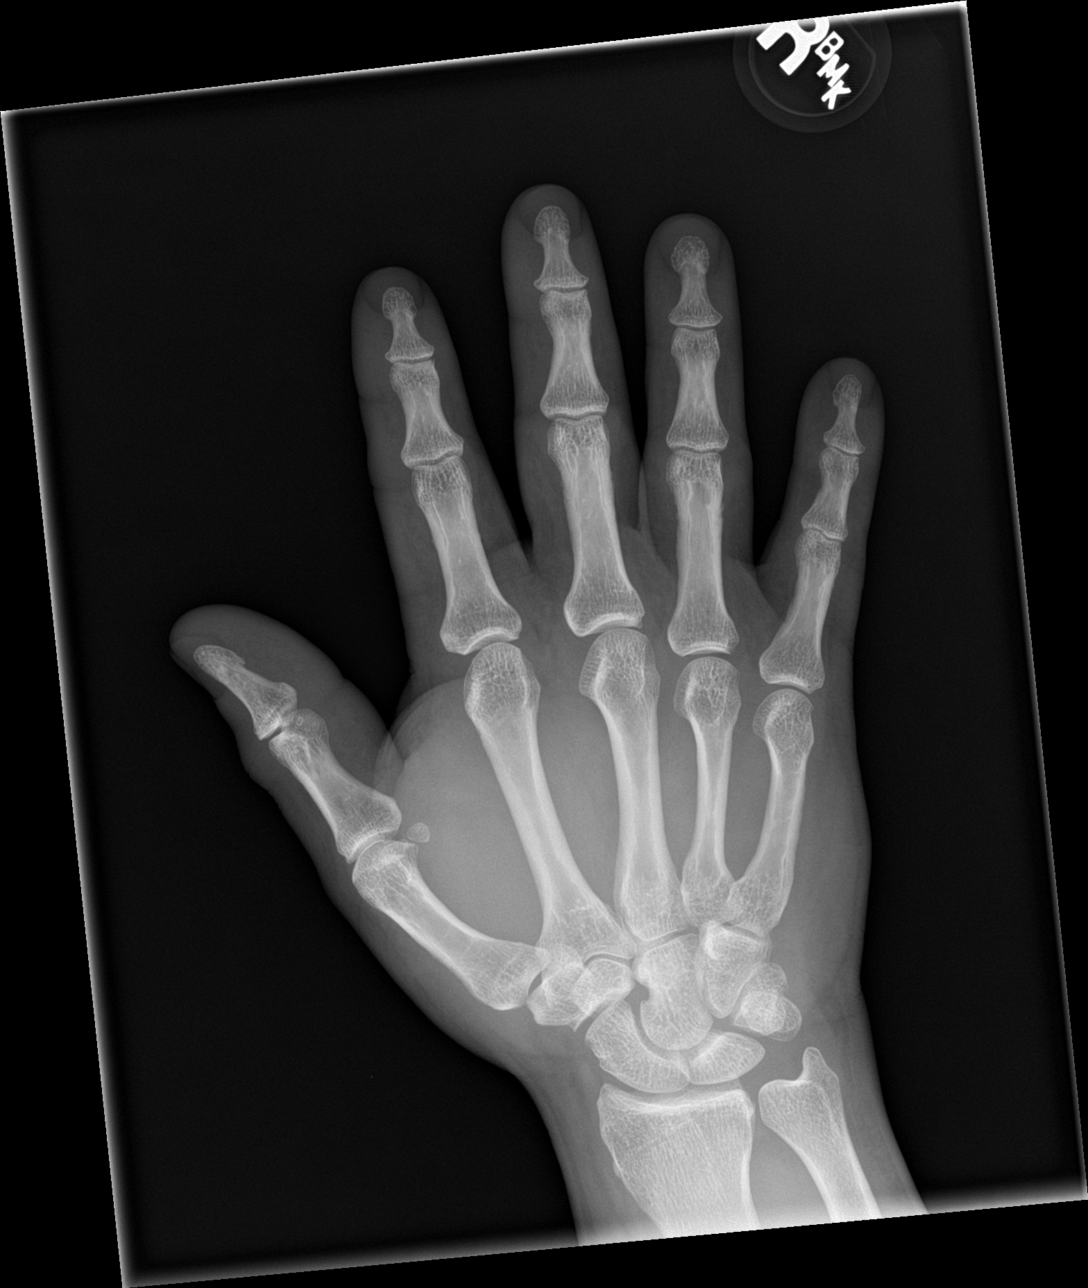

[hand obl]
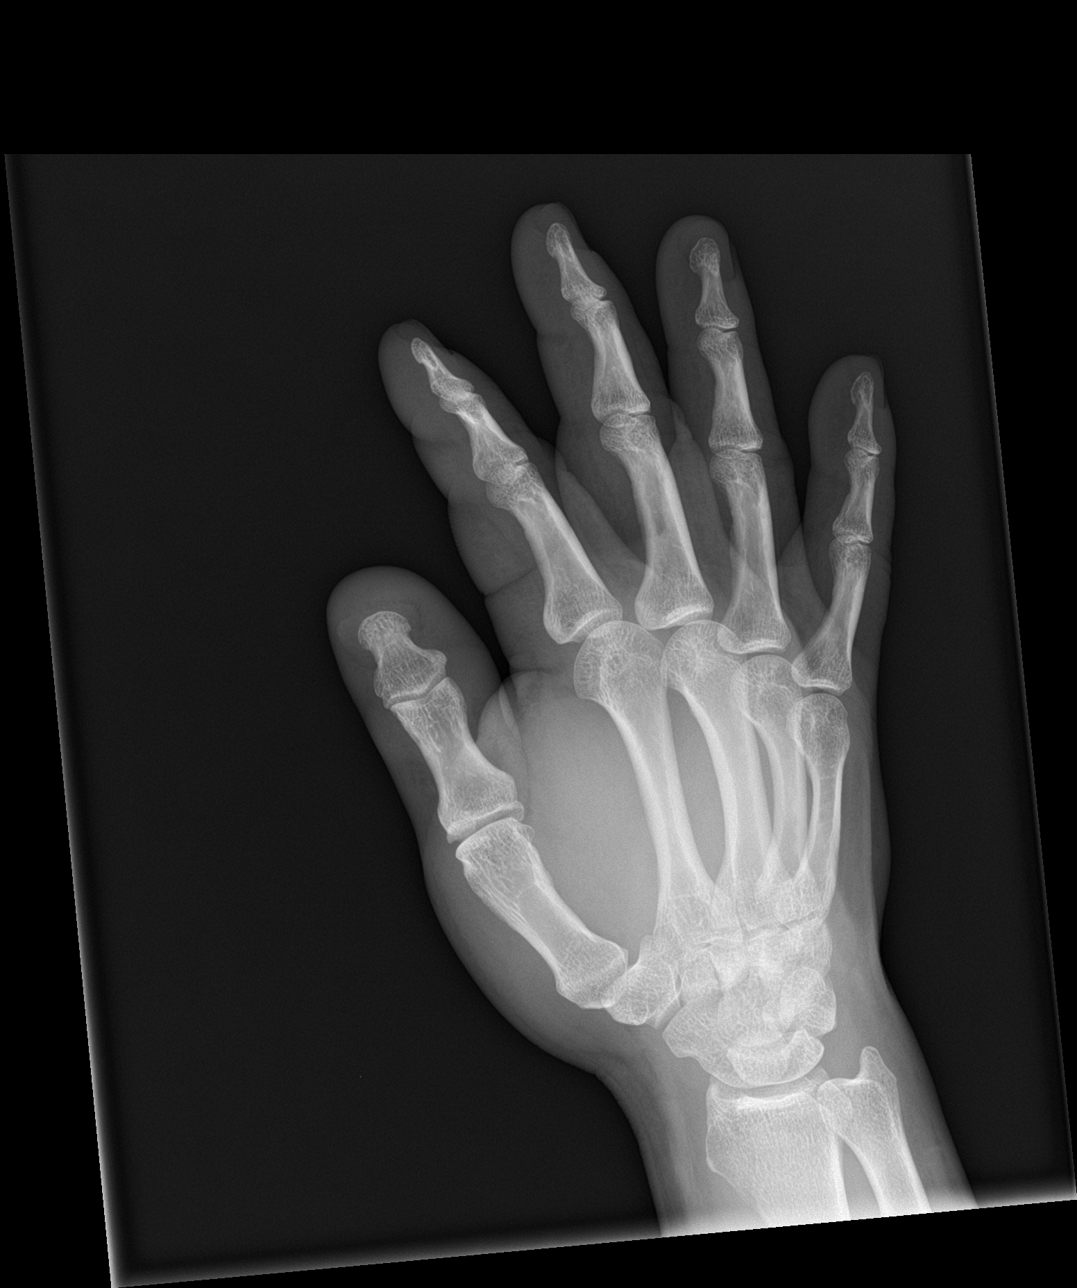

[hand lat]
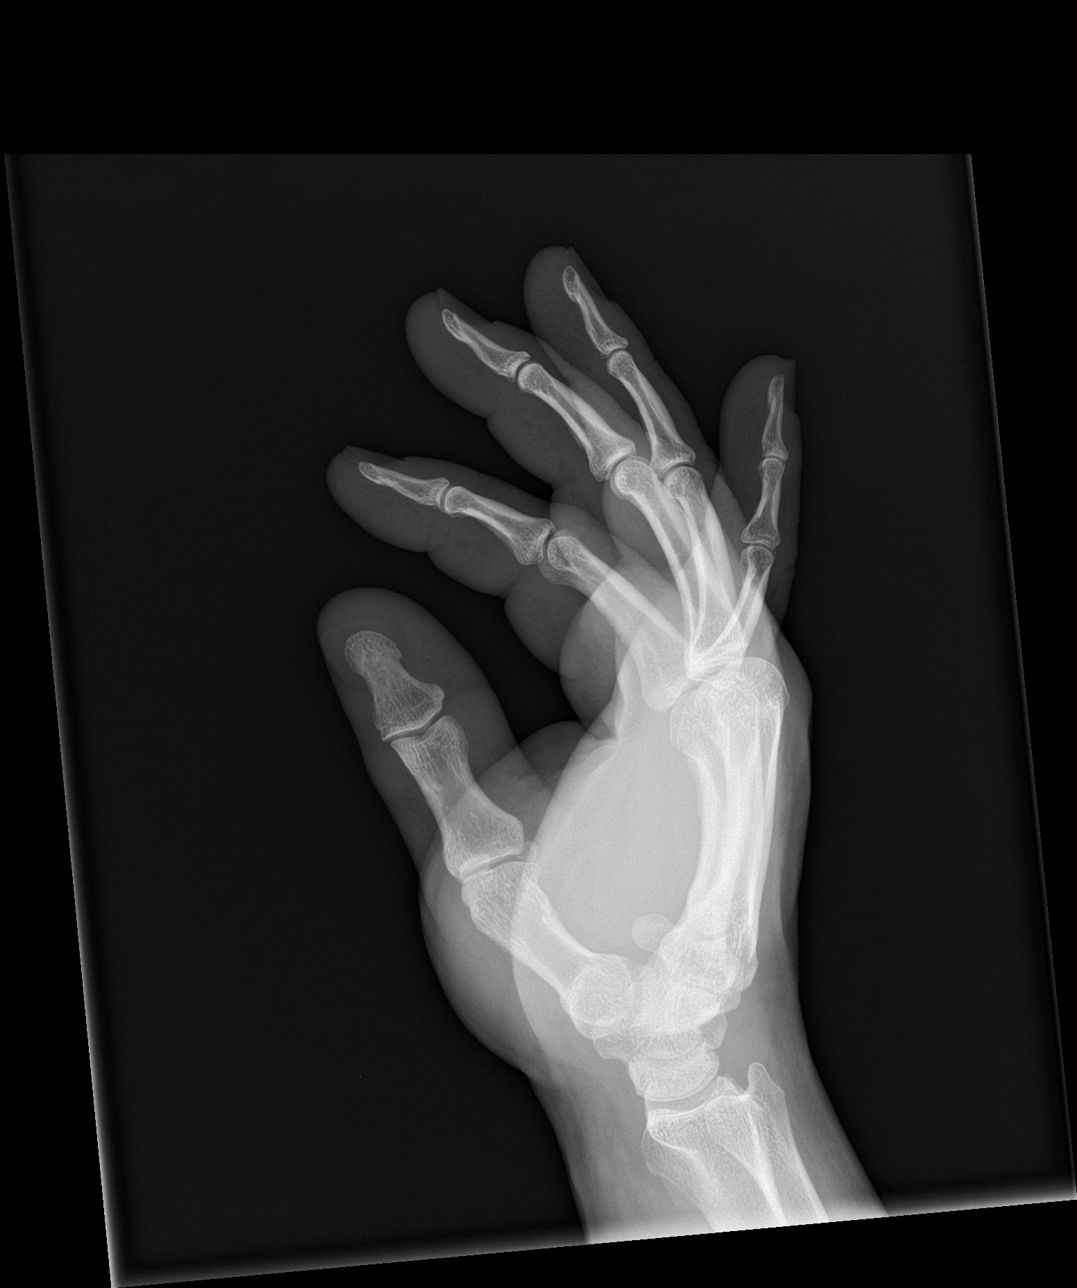

[3 of 3 positions shown; findings below may reference images not displayed]

FINDINGS: There is no evidence of fracture or dislocation. There is no
evidence of arthropathy or other focal bone abnormality. Soft tissue
edema about thumb. No soft tissue air or radiopaque foreign body.
IMPRESSION: Soft tissue edema about the thumb. No acute osseous abnormality.

## 2020-09-12 ENCOUNTER — Other Ambulatory Visit: Payer: Self-pay | Admitting: Family Medicine

## 2020-09-12 DIAGNOSIS — I10 Essential (primary) hypertension: Secondary | ICD-10-CM

## 2020-10-22 ENCOUNTER — Other Ambulatory Visit: Payer: Self-pay | Admitting: Family Medicine

## 2020-10-22 DIAGNOSIS — I10 Essential (primary) hypertension: Secondary | ICD-10-CM

## 2020-10-24 ENCOUNTER — Other Ambulatory Visit: Payer: Self-pay | Admitting: Family Medicine

## 2020-10-24 DIAGNOSIS — I10 Essential (primary) hypertension: Secondary | ICD-10-CM

## 2020-10-24 MED ORDER — AMLODIPINE BESYLATE 10 MG PO TABS: 10.0000 mg | ORAL_TABLET | Freq: Every day | ORAL | 0 refills | Status: AC

## 2020-11-23 ENCOUNTER — Other Ambulatory Visit: Payer: Self-pay | Admitting: Family Medicine

## 2020-11-23 DIAGNOSIS — I10 Essential (primary) hypertension: Secondary | ICD-10-CM

## 2021-10-24 ENCOUNTER — Encounter: Payer: Self-pay | Admitting: *Deleted
